# Patient Record
Sex: Male | Born: 1940 | Race: White | Hispanic: No | Smoking: Former smoker
Health system: Southern US, Community
[De-identification: ages and names within clinical notes are randomized; demographics above are authoritative.]

## PROBLEM LIST (undated history)

## (undated) DIAGNOSIS — I219 Acute myocardial infarction, unspecified: Secondary | ICD-10-CM

## (undated) DIAGNOSIS — I2510 Atherosclerotic heart disease of native coronary artery without angina pectoris: Secondary | ICD-10-CM

## (undated) DIAGNOSIS — M1990 Unspecified osteoarthritis, unspecified site: Secondary | ICD-10-CM

## (undated) DIAGNOSIS — N400 Enlarged prostate without lower urinary tract symptoms: Secondary | ICD-10-CM

## (undated) DIAGNOSIS — E039 Hypothyroidism, unspecified: Secondary | ICD-10-CM

## (undated) HISTORY — DX: Atherosclerotic heart disease of native coronary artery without angina pectoris: I25.10

## (undated) HISTORY — DX: Hypothyroidism, unspecified: E03.9

## (undated) HISTORY — PX: ELBOW SURGERY: SHX618

## (undated) HISTORY — PX: REPAIR KNEE LIGAMENT: SUR1188

## (undated) HISTORY — DX: Unspecified osteoarthritis, unspecified site: M19.90

## (undated) HISTORY — PX: CORONARY ANGIOPLASTY WITH STENT PLACEMENT: SHX49

## (undated) HISTORY — DX: Benign prostatic hyperplasia without lower urinary tract symptoms: N40.0

## (undated) HISTORY — PX: CATARACT EXTRACTION: SUR2

## (undated) HISTORY — PX: THUMB AMPUTATION: SHX804

## (undated) HISTORY — PX: MASTOIDECTOMY: SHX711

---

## 2010-02-27 ENCOUNTER — Encounter: Payer: Self-pay | Admitting: Internal Medicine

## 2010-09-01 ENCOUNTER — Ambulatory Visit
Admission: RE | Admit: 2010-09-01 | Discharge: 2010-09-01 | Payer: Self-pay | Source: Home / Self Care | Attending: Internal Medicine | Admitting: Internal Medicine

## 2010-09-01 ENCOUNTER — Other Ambulatory Visit: Payer: Self-pay | Admitting: Internal Medicine

## 2010-09-01 DIAGNOSIS — E1129 Type 2 diabetes mellitus with other diabetic kidney complication: Secondary | ICD-10-CM | POA: Insufficient documentation

## 2010-09-01 DIAGNOSIS — M1990 Unspecified osteoarthritis, unspecified site: Secondary | ICD-10-CM | POA: Insufficient documentation

## 2010-09-01 DIAGNOSIS — E785 Hyperlipidemia, unspecified: Secondary | ICD-10-CM | POA: Insufficient documentation

## 2010-09-01 DIAGNOSIS — E1165 Type 2 diabetes mellitus with hyperglycemia: Secondary | ICD-10-CM

## 2010-09-01 DIAGNOSIS — I2510 Atherosclerotic heart disease of native coronary artery without angina pectoris: Secondary | ICD-10-CM | POA: Insufficient documentation

## 2010-09-01 DIAGNOSIS — E0590 Thyrotoxicosis, unspecified without thyrotoxic crisis or storm: Secondary | ICD-10-CM | POA: Insufficient documentation

## 2010-09-01 DIAGNOSIS — N400 Enlarged prostate without lower urinary tract symptoms: Secondary | ICD-10-CM | POA: Insufficient documentation

## 2010-09-01 DIAGNOSIS — H9190 Unspecified hearing loss, unspecified ear: Secondary | ICD-10-CM | POA: Insufficient documentation

## 2010-09-01 LAB — LIPID PANEL
Cholesterol: 124 mg/dL (ref 0–200)
Triglycerides: 158 mg/dL — ABNORMAL HIGH (ref 0.0–149.0)

## 2010-09-01 LAB — CBC WITH DIFFERENTIAL/PLATELET
Basophils Absolute: 0 10*3/uL (ref 0.0–0.1)
Eosinophils Absolute: 0.4 10*3/uL (ref 0.0–0.7)
Eosinophils Relative: 5.1 % — ABNORMAL HIGH (ref 0.0–5.0)
MCHC: 34.1 g/dL (ref 30.0–36.0)
MCV: 91.8 fl (ref 78.0–100.0)
Monocytes Absolute: 0.7 10*3/uL (ref 0.1–1.0)
Neutrophils Relative %: 62.9 % (ref 43.0–77.0)
Platelets: 171 10*3/uL (ref 150.0–400.0)
RDW: 13.4 % (ref 11.5–14.6)
WBC: 7.4 10*3/uL (ref 4.5–10.5)

## 2010-09-01 LAB — RENAL FUNCTION PANEL
Calcium: 9.6 mg/dL (ref 8.4–10.5)
Creatinine, Ser: 1.2 mg/dL (ref 0.4–1.5)
Glucose, Bld: 72 mg/dL (ref 70–99)
Sodium: 145 mEq/L (ref 135–145)

## 2010-09-01 LAB — HEPATIC FUNCTION PANEL
AST: 28 U/L (ref 0–37)
Albumin: 4.2 g/dL (ref 3.5–5.2)
Alkaline Phosphatase: 61 U/L (ref 39–117)
Total Protein: 6.9 g/dL (ref 6.0–8.3)

## 2010-09-01 LAB — HEMOGLOBIN A1C: Hgb A1c MFr Bld: 8.4 % — ABNORMAL HIGH (ref 4.6–6.5)

## 2010-09-02 ENCOUNTER — Telehealth: Payer: Self-pay | Admitting: Internal Medicine

## 2010-09-04 ENCOUNTER — Encounter: Payer: Self-pay | Admitting: Internal Medicine

## 2010-09-10 ENCOUNTER — Other Ambulatory Visit: Payer: Medicare Other | Admitting: Ophthalmology

## 2010-09-10 NOTE — Assessment & Plan Note (Addendum)
Summary: NEW MEDICARE PT TO ESTABH/DLO   Vital Signs:  Patient profile:   70 year old male Height:      72 inches Weight:      228 pounds BMI:     31.03 Temp:     97.9 degrees F oral Pulse rate:   59 / minute Pulse rhythm:   regular BP sitting:   115 / 58  (left arm) Cuff size:   large  Vitals Entered By: Mervin Hack CMA Duncan Dull) (September 01, 2010 12:10 PM) CC: new patient to establish care Is Patient Diabetic? Yes Did you bring your meter with you today? No   History of Present Illness: Establishing here Just moved here  History of CAD  MI first in 1991 Has had another in 1996 (stent), 2 more stents in 2003, another in 2005 Now on statin despite fairly normal chol levels Last stress test  ~6 months ago  Has DM--diagnosed in 1991 with first MI Insulin since that time--then to by mouth rx--now on both  He is not sure about BP but he is on med for this Diuretic suggests the diagnosis of HTN but he states this is from ENT for hearing loss  Has been losing vision Not sure why needs eye exam  some arthritis problems Larey Seat off ladder from 6 feet 2009 Left knee damage then  Distant history of hyperthyroidism on meds for awhile but not of late  BPH ---flomax in past but off now  Preventive Screening-Counseling & Management  Alcohol-Tobacco     Smoking Status: quit  Allergies (verified): 1)  ! 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 Niece Delia Chimes has health care POA. Would accept resuscitation but no prolonged life support. Not sure about tube feedsSmoking Status:  quit  Review of Systems General:  Weight is stable--may be down slightly since June sleeps fair--- rarely takes "z" sleeping pill wears seat belt. Eyes:  Complains of blurring, double vision, and vision loss-both eyes. ENT:  Complains of decreased hearing and ringing in ears; Own teeth--sees dentist regularly. CV:  Complains of lightheadness; denies chest pain or discomfort, difficulty breathing at night, difficulty breathing while lying down, fainting, palpitations, and shortness of breath with exertion; gets dizzy if he bends over---apparently vestibular. Uses meclizine. Resp:  Complains of cough; denies shortness of breath; occ cough. GI:  Complains of constipation; denies abdominal pain, bloody stools, change in bowel habits, dark tarry stools, indigestion, nausea, and vomiting; slight constipation with move and travelling occ uses glycerin supp. GU:  Complains of urinary frequency and urinary hesitancy; up once to void. MS:  Complains of joint pain; denies joint swelling; left knee pain Not much exercise. Derm:  Denies lesion(s) and rash; gets scratched by kitten. Neuro:  Complains of headaches; denies numbness, tingling, and weakness; occ headaches--tylenol helps Feet get cold but not numb. Psych:  Denies anxiety and depression; has had some grieving with wife's death but seems to be okay. Heme:  Denies abnormal bruising and  enlarge lymph nodes; no problems with bruising on lower dose aspirin. Allergy:  Complains of seasonal allergies and sneezing; usually doesn't take meds.  Physical Exam  General:  alert and normal appearance.   Eyes:  pupils equal, pupils round, and pupils reactive to light.   Mouth:  no erythema, no exudates, and  no lesions.   Neck:  supple, no masses, no thyromegaly, no carotid bruits, and no cervical lymphadenopathy.   Lungs:  normal respiratory effort, no intercostal retractions, no accessory muscle use, and normal breath sounds.   Heart:  normal rate, regular rhythm, no murmur, and no gallop.   Abdomen:  soft and non-tender.   Msk:  thckening in hands and decreased flexibility fairly widespread Pulses:  1+ in feet Extremities:  slight dependent rubor but no edema Neurologic:  alert & oriented X3, strength normal in all extremities, and gait normal.   Skin:  no suspicious lesions and no ulcerations.   Axillary Nodes:  No palpable lymphadenopathy Psych:  normally interactive, good eye contact, not anxious appearing, and not depressed appearing.  Still some anger and frustration about wife's death---had MI and felt it could have been found earlier  Diabetes Management Exam:    Foot Exam (with socks and/or shoes not present):       Sensory-Pinprick/Light touch:          Left medial foot (L-4): normal          Left dorsal foot (L-5): normal          Left lateral foot (S-1): normal          Right medial foot (L-4): normal          Right dorsal foot (L-5): normal          Right lateral foot (S-1): normal       Inspection:          Left foot: normal          Right foot: normal       Nails:          Left foot: fungal infection          Right foot: fungal infection   Impression & Recommendations:  Problem # 1:  CORONARY ARTERY DISEASE (ICD-414.00) Assessment Unchanged  seems to be stable EKG done---serves as my baseline (sinus brady, LAHB, prior AWMI) will set up with local cardiologist  His updated medication list for this problem includes:    Metoprolol Tartrate 50 Mg Tabs (Metoprolol tartrate) .Marland Kitchen... Take 1 by mouth every 12 hours once daily    Losartan Potassium 50 Mg Tabs (Losartan potassium) .Marland Kitchen... Take 1 by mouth once daily    Triamterene-hctz 37.5-25 Mg Caps (Triamterene-hctz) .Marland Kitchen...  Take 1 by mouth once daily    Nitrostat 0.4 Mg Subl (Nitroglycerin) .Marland Kitchen... As directed  Orders: EKG w/ Interpretation (93000) TLB-Renal Function Panel (80069-RENAL) TLB-CBC Platelet - w/Differential (85025-CBCD) Cardiology Referral (Cardiology)  Problem # 2:  DIABETES MELLITUS, TYPE II (ICD-250.00) Assessment: Comment Only  will check labs hopefully okay needs eye appt  His updated medication list for this problem includes:    Metformin Hcl 500 Mg Tabs (Metformin hcl) .Marland Kitchen... Take 2 by mouth two times a day    Humulin 70/30 70-30 % Susp (Insulin isophane & regular) ..... Inject 50-60units every morning and 20-40 units every evening    Losartan Potassium 50 Mg Tabs (Losartan potassium) .Marland Kitchen... Take 1 by mouth once daily  Orders: TLB-A1C / Hgb A1C (Glycohemoglobin) (16109-U0A) Ophthalmology Referral (Ophthalmology)  Problem # 3:  HYPERLIPIDEMIA (ICD-272.4) Assessment: Comment Only  will check labs  His updated medication list for this problem includes:    Simvastatin 40 Mg Tabs (Simvastatin) .Marland Kitchen... Take 1 by mouth once daily at bedtime  Orders: TLB-Lipid Panel (80061-LIPID) TLB-Hepatic/Liver Function Pnl (80076-HEPATIC)  Problem # 4:  BENIGN PROSTATIC HYPERTROPHY (ICD-600.00) Assessment: Unchanged mild  okay without meds for now  Problem # 5:  HYPERTHYROIDISM (ICD-242.90) Assessment: Comment Only  will check labs  His updated medication list for this problem includes:    Metoprolol Tartrate 50 Mg Tabs (Metoprolol tartrate) .Marland Kitchen... Take 1 by mouth every 12 hours once daily  Orders: TLB-T4 (Thyrox), Free [phone]) TLB-TSH (Thyroid Stimulating Hormone) (84443-TSH) Venipuncture (91478)  Problem # 6:  DECREASED HEARING (ICD-389.9) Assessment: Deteriorated  needs ENT eval  Orders: ENT Referral (ENT)  Complete Medication List: 1)  Metoprolol Tartrate 50 Mg Tabs (Metoprolol tartrate) .... Take 1 by mouth every 12 hours once daily 2)  Metformin Hcl 500 Mg Tabs  (Metformin hcl) .... Take 2 by mouth two times a day 3)  Humulin 70/30 70-30 % Susp (Insulin isophane & regular) .... Inject 50-60units every morning and 20-40 units every evening 4)  Losartan Potassium 50 Mg Tabs (Losartan potassium) .... Take 1 by mouth once daily 5)  Triamterene-hctz 37.5-25 Mg Caps (Triamterene-hctz) .... Take 1 by mouth once daily 6)  Simvastatin 40 Mg Tabs (Simvastatin) .... Take 1 by mouth once daily at bedtime 7)  Motion Sickness Relief Ii 25 Mg Tabs (Meclizine hcl) .... Take 1 by mouth once daily as needed 8)  Omega-3 350 Mg Caps (Omega-3 fatty acids) .... Take 1 by mouth once daily as needed 9)  One-a-day 50 Plus Tabs (Multiple vitamins-minerals) .... Take 1 by mouth once daily 10)  Nitrostat 0.4 Mg Subl (Nitroglycerin) .... As directed  Other Orders: Pneumococcal Vaccine (29562) Admin 1st Vaccine (13086)  Patient Instructions: 1)  Please get last 3 years of records from physician in [place] 2)  Please schedule a follow-up appointment in 4 months .  3)  Please set up appts with eye doctor, ENT for hearing and cardiologist 4)  Referral Appointment Information 5)  Day/Date: 6)  Time: 7)  Place/MD: 8)  Address: 9)  Phone/Fax: 10)  Patient given appointment information. Information/Orders faxed/mailed. 11)  Referral Appointment Information 12)  Day/Date: 13)  Time: 14)  Place/MD: 15)  Address: 16)  Phone/Fax: 17)  Patient given appointment information. Information/Orders faxed/mailed. 18)  Referral Appointment Information 19)  Day/Date: 20)  Time: 21)  Place/MD: 22)  Address: 23)  Phone/Fax: 24)  Patient given appointment information. Information/Orders faxed/mailed. Prescriptions: SIMVASTATIN 40 MG TABS (SIMVASTATIN) take 1 by mouth once daily at bedtime  #30 x 0   Entered and Authorized by:   Cindee Salt MD   Signed by:   Cindee Salt MD on 09/01/2010   Method used:   Electronically to        [facility]  [address] [phone]* (retail)        [address]       [place], [place]  [address]       Ph: [phone]       Fax: [phone]   RxID:   [id number] TRIAMTERENE-HCTZ 37.5-25 MG CAPS (TRIAMTERENE-HCTZ) take 1 by mouth once daily  #30 x 0   Entered and Authorized by:   Cindee Salt MD   Signed by:   Cindee Salt  MD on 09/01/2010   Method used:   [facility]        [facility]  [address] [id number] ([facility]ail)       [address]       [place], [place]  [address]       Ph: [phone]       Fax: [phone]   RxID:   [id number] LOSARTAN POTASSIUM 50 MG TABS (LOSARTAN POTASSIUM) take 1 by mouth once daily  #30 x 0   Entered and Authorized by:   Cindee Salt MD   Signed by:   Cindee Salt MD on 09/01/2010   Method used:   [facility]ronically [facility]        [facility]  [address] [phone]* (retail)       [address]       [place], [place]  [address]       Ph: [phone]       Fax: [phone]   RxID:   [id number] HUMULIN 70/30 70-30 % SUSP (INSULIN ISOPHANE & REGULAR) inject 50-60units every morning and 20-40 units every evening  #3000 units x 0   Entered and Authorized by:   Cindee Salt MD   Signed by:   Cindee Salt MD on 09/01/2010   Method used:   [facility]ronically to        [facility]  [address] [phone]* (retail)       [address]       [place], [place]  [address]       Ph: [phone]       Fax: [phone]   RxID:   [id number] METFORMIN HCL 500 MG TABS (METFORMIN HCL) take 2 by mouth two times a day  #60 x 0   Entered and Authorized by:   Cindee Salt MD   Signed by:   Cindee Salt MD on 09/01/2010   Method used:   [facility]ically to        [facility]  [address] [phone]* (retail)       [address]       [place], [place]  [address]       Ph: [phone]       Fax: [phone]   RxID:   [id number] METOPROLOL TARTRATE 50 MG TABS (METOPROLOL  TARTRATE) take 1 by mouth every 12 hours once daily  #60 x 0   Entered and Authorized by:   Cindee Salt MD   Signed by:   Cindee Salt MD on 09/01/2010   Method used:   [facility] to        [facility]  [address] [phone]* (retail)       [address]       [place], [place]  [address]       Ph: [phone]       Fax: [phone]   RxID:   [id number] SIMVASTATIN 40 MG TABS (SIMVASTATIN) take 1 by mouth once daily at bedtime  #90 x 3   Entered and Authorized by:   Cindee Salt MD   Signed by:   Cindee Salt MD on 09/01/2010   Method used:   Print then Give to Patient   RxID:   [id number] TRIAMTERENE-HCTZ 37.5-25 MG  CAPS (TRIAMTERENE-HCTZ) take 1 by mouth once daily  #90 x 3   Entered and Authorized by:   Cindee Salt MD   Signed by:   Cindee Salt MD on 09/01/2010   Method used:   Print then Give to Patient   RxID:   [id number] POTASSIUM 50 MG TABS (LOSARTAN POTASSIUM) take 1 by mouth once daily  #90 x 3   Entered and Authorized by:   Cindee Salt MD   Signed by:   Cindee Salt MD on 09/01/2010   Method used:   Print then Give to Patient   RxID:   [id number] HUMULIN 70/30 70-30 % SUSP (INSULIN ISOPHANE & REGULAR) inject 50-60units every morning and 20-40 units every evening  #3 month x 3   Entered and Authorized by:   Cindee Salt MD   Signed by:   Cindee Salt MD on 09/01/2010   Method used:   Print then Give to Patient   RxID:   [id number] METFORMIN HCL 500 MG TABS (METFORMIN HCL) take 2 by mouth two times a day  #180 x 3   Entered and Authorized by:   Cindee Salt MD   Signed by:   Cindee Salt MD on 09/01/2010   Method used:   Print then Give to Patient   RxID:   [id number] METOPROLOL TARTRATE 50 MG TABS (METOPROLOL TARTRATE) take 1 by mouth every 12 hours once daily  #180 x 3   Entered and Authorized by:   Cindee Salt MD   Signed by:    Cindee Salt MD on 09/01/2010   Method used:   Print then Give to Patient   RxID:   [id number]    Orders Added: 1)  Pneumococcal Vaccine [73710] 2)  Admin 1st Vaccine [90471] 3)  New Patient Level IV [62694] 4)  EKG w/ Interpretation [93000] 5)  TLB-A1C / Hgb A1C (Glycohemoglobin) [83036-A1C] 6)  TLB-T4 (Thyrox), Free [85462-VO3J] 7)  TLB-TSH (Thyroid Stimulating Hormone) [84443-TSH] 8)  Venipuncture [36415] 9)  TLB-Renal Function Panel [80069-RENAL] 10)  TLB-CBC Platelet - w/Differential [85025-CBCD] 11)  TLB-Lipid Panel [80061-LIPID] 12)  TLB-Hepatic/Liver Function Pnl [80076-HEPATIC] 13)  Ophthalmology Referral [Ophthalmology] 14)  ENT Referral [ENT] 15)  Cardiology Referral [Cardiology]   Immunization History:  Tetanus/Td Immunization History:    Tetanus/Td:  Historical (08/10/2003)  Pneumovax Immunization History:    Pneumovax:  Pneumovax (Medicare) (09/01/2010)  Immunizations Administered:  Pneumonia Vaccine:    Vaccine Type: Pneumovax (Medicare)    Site: left deltoid    Mfr: Merck    Dose: 0.5 ml    Route: IM    Given by: Mervin Hack CMA (AAMA)    Exp. Date: 01/01/2012    Lot #: [id number]    VIS given: 07/14/09 version given September 01, 2010.   Immunization History:  Tetanus/Td Immunization History:    Tetanus/Td:  Historical (08/10/2003)  Immunizations Administered:  Pneumonia Vaccine:    Vaccine Type: Pneumovax (Medicare)    Site: left deltoid    Mfr: Merck    Dose: 0.5 ml    Route: IM    Given by: Mervin Hack CMA (AAMA)    Exp. Date: 01/01/2012    Lot #: [id number]    VIS given: 07/14/09 version given September 01, 2010.  Current Allergies (reviewed today): ! FLOXIN    Colonoscopy  Procedure date:  08/09/2000  Findings:      Results: Normal. ??hyperplastic polyps or tag not sure of date  Appended Document: NEW MEDICARE PT TO ESTABH/DLO Copy of note faxed to [facility], fax [phone].

## 2010-09-10 NOTE — Progress Notes (Signed)
Summary: metformin  Phone Note Refill Request Message from:  Patient on September 02, 2010 1:57 PM  Refills Requested: Medication #1:  METFORMIN HCL 500 MG TABS take 2 by mouth two times a day Patient says that he will be taking 4 tabs a day and he will need 360 instead of 180. Please call patient when rx is ready.  Initial call taken by: Melody Comas,  September 02, 2010 1:58 PM  Follow-up for Phone Call        please change to 1000mg  tabs and prepare Rx for me I will sign tomorrrow Follow-up by: Cindee Salt MD,  September 02, 2010 2:07 PM  Additional Follow-up for Phone Call Additional follow up Details #1::        rx on your desk for signing Additional Follow-up by: DeShannon Smith CMA Duncan Dull),  September 02, 2010 2:59 PM    New/Updated Medications: METFORMIN HCL 1000 MG TABS (METFORMIN HCL) take 2 by mouth once daily Prescriptions: METFORMIN HCL 1000 MG TABS (METFORMIN HCL) take 2 by mouth once daily  #180 x 3   Entered by:   Mervin Hack CMA (AAMA)   Authorized by:   Cindee Salt MD   Signed by:   Mervin Hack CMA (AAMA) on 09/02/2010   Method used:   Print then Give to Patient   RxID:   [id number]

## 2010-09-18 ENCOUNTER — Encounter: Payer: Self-pay | Admitting: Internal Medicine

## 2010-09-18 LAB — HM DIABETES EYE EXAM

## 2010-09-24 ENCOUNTER — Encounter: Payer: Self-pay | Admitting: Cardiovascular Disease

## 2010-09-24 ENCOUNTER — Ambulatory Visit (INDEPENDENT_AMBULATORY_CARE_PROVIDER_SITE_OTHER): Payer: Medicare Other | Admitting: Cardiovascular Disease

## 2010-09-24 DIAGNOSIS — I2510 Atherosclerotic heart disease of native coronary artery without angina pectoris: Secondary | ICD-10-CM

## 2010-09-24 NOTE — Letter (Signed)
Summary: Lab reports 2009-2011  Lab reports 2009-2011   Imported By: Kassie Mends 09/17/2010 08:12:11  _____________________________________________________________________  External Attachment:    Type:   Image     Comment:   External Document

## 2010-09-24 NOTE — Consult Note (Signed)
Summary: [facility]  [facility]   Imported By: Maryln Gottron 09/17/2010 15:30:48  _____________________________________________________________________  External Attachment:    Type:   Image     Comment:   External Document  Appended Document: [facility] hearing loss trying to get surgery records from [facility] will review aide options

## 2010-09-30 NOTE — Assessment & Plan Note (Signed)
Summary: Ronnie Case   Visit Type:  Initial Consult Primary Provider:  Dr. Alphonsus Sias [name]  CC:  pt has no complaints today.  History of Present Illness: 70 yo WM with history of CAD with first MI 36 with stenting procedures in 1996, 2003 and 2005 as well as history of DM, HTN and hyperthyroidism  who is here today as a new patient to establish cardiac care. He has recently moved to our area from  [place]. He was recently seen by Dr. Alphonsus Sias. He has been doing well. No chest pain, SOB, palpitations or near syncope.  Stress test at [facility] in 2011 was normal per pt.   Current Medications (verified): 1)  Metoprolol Tartrate 50 Mg Tabs (Metoprolol Tartrate) .... Take 1 By Mouth Every 12 Hours Once Daily 2)  Humulin 70/30 70-30 % Susp (Insulin Isophane & Regular) .... Inject 50-60units Every Morning and 20-40 Units Every Evening 3)  Losartan Potassium 50 Mg Tabs (Losartan Potassium) .... Take 1 By Mouth Once Daily 4)  Triamterene-Hctz 37.5-25 Mg Caps (Triamterene-Hctz) .... Take 1 By Mouth Once Daily 5)  Simvastatin 40 Mg Tabs (Simvastatin) .... Take 1 By Mouth Once Daily At Bedtime 6)  Motion Sickness Relief Ii 25 Mg Tabs (Meclizine Hcl) .... Take 2 By Mouth Once Daily 7)  Omega-3 350 Mg Caps (Omega-3 Fatty Acids) .... Take 1 By Mouth Once Daily As Needed 8)  One-A-Day 50 Plus  Tabs (Multiple Vitamins-Minerals) .... Take 1 By Mouth Once Daily 9)  Nitrostat 0.4 Mg Subl (Nitroglycerin) .... As Directed 10)  Metformin Hcl 1000 Mg Tabs (Metformin Hcl) .... Take 2 By Mouth Once Daily 11)  Aspirin 81 Mg Tbec (Aspirin) .... Take One Tablet By Mouth Daily  Allergies (verified): 1)  ! Floxin  Past History:  Past Medical History: Coronary artery disease s/p MI 1991, stents 1996, 2003, 2005.  Diabetes mellitus, type II Hyperthyroidism Osteoarthritis Benign prostatic hypertrophy  Past Surgical History: 1970 Work accident--left thumb amputated Cardiac stents 1996, 2003, 2005 Left  elbow surgery (tennis elbow) Radical mastoidectomy on right 1982 (infection) Left knee repair after fall 2009 B cataract surgery 2010  Family History: Reviewed history from 09/01/2010 and no changes required. Mom still alive Dad died of complications after broken hip @80  2 sisters CAD, DM strong in family No prostate or colon cancer  Social History: Reviewed history from 09/01/2010 and no changes required. 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 Niece Delia Chimes has health care POA. Would accept resuscitation but no prolonged life support. Not sure about tube feeds  Review of Systems  The patient denies fatigue, malaise, fever, weight gain/loss, vision loss, decreased hearing, hoarseness, chest pain, palpitations, shortness of breath, prolonged cough, wheezing, sleep apnea, coughing up blood, abdominal pain, blood in stool, nausea, vomiting, diarrhea, heartburn, incontinence, blood in urine, muscle weakness, joint pain, leg swelling, rash, skin lesions, headache, fainting, dizziness, depression, anxiety, enlarged lymph nodes, easy bruising or bleeding, and environmental allergies.    Vital Signs:  Patient profile:   70 year old male Height:      72 inches Weight:      232.50 pounds BMI:     31.65 Pulse rate:   72 / minute Resp:     18 per minute BP sitting:   106 / 68  (left arm)  Vitals Entered By: Celestia Khat, CMA (September 24, 2010 11:48 AM)  Physical Exam  General:  General: Well  developed, well nourished, NAD HEENT: OP clear, mucus membranes moist SKIN: warm, dry Neuro: No focal deficits Musculoskeletal: Muscle strength 5/5 all ext Psychiatric: Mood and affect normal Neck: No JVD, no carotid bruits, no thyromegaly, no lymphadenopathy. Lungs:Clear bilaterally, no wheezes, rhonci, crackles CV: RRR no murmurs, gallops rubs Abdomen: soft, NT, ND, BS  present Extremities: No edema, pulses 2+.    EKG  Procedure date:  09/01/2010  Findings:      sinus brady, 58 bpm. LAFB. Poor R wave progression precordial leads.   Impression & Recommendations:  Problem # 1:  CORONARY ARTERY DISEASE (ICD-414.00) Stable. No changes in therapy. He is on good medical therapy.   His updated medication list for this problem includes:    Metoprolol Tartrate 50 Mg Tabs (Metoprolol tartrate) .Marland Kitchen... Take 1 by mouth every 12 hours once daily    Nitrostat 0.4 Mg Subl (Nitroglycerin) .Marland Kitchen... As directed    Aspirin 81 Mg Tbec (Aspirin) .Marland Kitchen... Take one tablet by mouth daily  His updated medication list for this problem includes:    Metoprolol Tartrate 50 Mg Tabs (Metoprolol tartrate) .Marland Kitchen... Take 1 by mouth every 12 hours once daily    Nitrostat 0.4 Mg Subl (Nitroglycerin) .Marland Kitchen... As directed    Aspirin 81 Mg Tbec (Aspirin) .Marland Kitchen... Take one tablet by mouth daily  Patient Instructions: 1)  Your physician recommends that you schedule a follow-up appointment in: 6 months.

## 2010-10-06 NOTE — Letter (Signed)
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

## 2010-12-18 ENCOUNTER — Encounter: Payer: Self-pay | Admitting: Internal Medicine

## 2010-12-21 ENCOUNTER — Ambulatory Visit (INDEPENDENT_AMBULATORY_CARE_PROVIDER_SITE_OTHER): Payer: Medicare Other | Admitting: Internal Medicine

## 2010-12-21 ENCOUNTER — Encounter: Payer: Self-pay | Admitting: Internal Medicine

## 2010-12-21 VITALS — BP 130/60 | HR 58 | Temp 97.6°F | Ht 72.0 in | Wt 227.0 lb

## 2010-12-21 DIAGNOSIS — M1990 Unspecified osteoarthritis, unspecified site: Secondary | ICD-10-CM

## 2010-12-21 DIAGNOSIS — N400 Enlarged prostate without lower urinary tract symptoms: Secondary | ICD-10-CM

## 2010-12-21 DIAGNOSIS — E119 Type 2 diabetes mellitus without complications: Secondary | ICD-10-CM

## 2010-12-21 DIAGNOSIS — E785 Hyperlipidemia, unspecified: Secondary | ICD-10-CM

## 2010-12-21 DIAGNOSIS — I2510 Atherosclerotic heart disease of native coronary artery without angina pectoris: Secondary | ICD-10-CM

## 2010-12-21 NOTE — Progress Notes (Signed)
Subjective:    Patient ID: Ronnie Case, male    DOB: May 25, 1941, 70 y.o.   MRN: [id number]  HPI DOing okay BIL [facility]) not doing well. Mom in hospice home Stress with this  Saw Dr Sanjuana Kava No changes--doing well No chest pain--carries NTG but hasn't needed it Breathing is fine  Sugars are slightly better than last time Checks about daily--no longer checking bid Has had 2 low sugar readings--could feel the symptoms. No neuroglycopenia  Voiding okay Feels he can't empty first time in AM---needs to walk around and then goes again Awakens at 4AM to void  Ongoing mild arthritis pain Uses tylenol with some relief  Current outpatient prescriptions:aspirin 81 MG tablet, Take 81 mg by mouth daily.  , Disp: , Rfl: ;  fish oil-omega-3 fatty acids 1000 MG capsule, Take 1 g by mouth daily.  , Disp: , Rfl: ;  insulin NPH-insulin regular (NOVOLIN 70/30) (70-30) 100 UNIT/ML injection, Inject 50-60 Units into the skin daily with breakfast. 20-40 units every evening , Disp: , Rfl: ;  losartan (COZAAR) 50 MG tablet, Take 50 mg by mouth daily.  , Disp: , Rfl:  metFORMIN (GLUCOPHAGE) 1000 MG tablet, Take 1,000 mg by mouth 2 (two) times daily with a meal.  , Disp: , Rfl: ;  metoprolol (LOPRESSOR) 50 MG tablet, Take 50 mg by mouth 2 (two) times daily.  , Disp: , Rfl: ;  Multiple Vitamins-Minerals (ONE-A-DAY 50 PLUS PO), Take by mouth daily.  , Disp: , Rfl: ;  nitroGLYCERIN (NITROSTAT) 0.4 MG SL tablet, Place 0.4 mg under the tongue every 5 (five) minutes as needed.  , Disp: , Rfl:  simvastatin (ZOCOR) 40 MG tablet, Take 40 mg by mouth at bedtime.  , Disp: , Rfl: ;  triamterene-hydrochlorothiazide (MAXZIDE-25) 37.5-25 MG per tablet, Take 1 tablet by mouth daily.  , Disp: , Rfl: ;  meclizine (MOTION SICKNESS RELIEF II) 25 MG tablet, Take 50 mg by mouth daily as needed.  , Disp: , Rfl:   Past Medical History  Diagnosis Date  . CAD (coronary artery disease)     s/p MI 1991, stents 1996, 2003, 2005.     . Diabetes mellitus   . Hypothyroidism   . Osteoarthritis   . BPH (benign prostatic hypertrophy)     Past Surgical History  Procedure Date  . Thumb amputation     1970 Work accident--left thumb amputated  . Coronary angioplasty with stent placement     Cardiac stents 1996, 2003, 2005  . Elbow surgery     left ( tennis)  . Mastoidectomy     Radical mastoidectomy on right 1982 (infection)  . Repair knee ligament     Left knee repair after fall 2009  . Cataract extraction     B cataract surgery 2010    Family History  Problem Relation Age of Onset  . Cancer Neg Hx     no prostate or colon cancer    History   Social History  . Marital Status: Widowed    Spouse Name: N/A    Number of Children: N/A  . Years of Education: N/A   Occupational History  . retired- Passenger transport manager 2003    Social History Main Topics  . Smoking status: Former Games developer  . Smokeless tobacco: Not on file  . Alcohol Use: No  . Drug Use: No  . Sexually Active: Not on file   Other Topics Concern  . Not on file   Social History Narrative  [name]'s brother. Niece Delia Chimes has health care POA.Would accept resuscitation but no prolonged life support. Not sure about tube feeds   Review of Systems Weight is stable Sleeps fairly well     Objective:   Physical Exam  Constitutional: He appears well-developed and well-nourished. No distress.  Neck: Normal range of motion. Neck supple. No thyromegaly present.  Cardiovascular: Normal rate, regular rhythm, normal heart sounds and intact distal pulses.  Exam reveals no gallop.   No murmur heard. Pulmonary/Chest: Effort normal and breath sounds normal. No respiratory distress. He has no wheezes. He has no rales.  Musculoskeletal: Normal range of motion. He exhibits no edema and no tenderness.  Lymphadenopathy:    He has no cervical adenopathy.  Neurological:       ??slight decreased sensation on plantar feet  Skin: No rash noted.        onchymycotic toenails---need trimming Early plantar callous  Psychiatric: He has a normal mood and affect. His behavior is normal. Judgment and thought content normal.          Assessment & Plan:

## 2010-12-25 ENCOUNTER — Encounter: Payer: Self-pay | Admitting: *Deleted

## 2010-12-25 ENCOUNTER — Telehealth: Payer: Self-pay | Admitting: *Deleted

## 2010-12-25 NOTE — Telephone Encounter (Signed)
Left message on cell and home number asking pt to return my call. 

## 2010-12-25 NOTE — Telephone Encounter (Signed)
Message copied by Mervin Hack on Fri Dec 25, 2010  9:53 AM ------      Message from: Tillman Abide      Created: Tue Dec 22, 2010  7:40 AM       Please call      Let him know the diabetes control has worsened with HgbA1c up to 9.4%      Please find out exactly how much insulin he uses daily      We should go up on it but I think he has not been careful at all      Have him work on proper eating and walking or other activity daily      Set up repeat A1c in ~2 months      If it is not better then, we will increase his insulin      If he would like to bump up his insulin by 10 units morning and evening now, that would probably be a good idea

## 2011-01-07 ENCOUNTER — Encounter: Payer: Self-pay | Admitting: *Deleted

## 2011-01-07 NOTE — Telephone Encounter (Signed)
Patient not returning my call, will mail letter to home address.

## 2011-01-20 ENCOUNTER — Telehealth: Payer: Self-pay | Admitting: *Deleted

## 2011-01-20 NOTE — Telephone Encounter (Signed)
Patient called back to advise that he's taking 50units in the morning and 50 units in the evening of insulin, pt scheduled lab appt in to repeat A1c.

## 2011-03-02 ENCOUNTER — Other Ambulatory Visit: Payer: Self-pay | Admitting: Internal Medicine

## 2011-03-02 DIAGNOSIS — E119 Type 2 diabetes mellitus without complications: Secondary | ICD-10-CM

## 2011-03-08 ENCOUNTER — Other Ambulatory Visit (INDEPENDENT_AMBULATORY_CARE_PROVIDER_SITE_OTHER): Payer: Medicare Other

## 2011-03-08 DIAGNOSIS — E119 Type 2 diabetes mellitus without complications: Secondary | ICD-10-CM

## 2011-04-13 ENCOUNTER — Ambulatory Visit (INDEPENDENT_AMBULATORY_CARE_PROVIDER_SITE_OTHER): Payer: Medicare Other | Admitting: Internal Medicine

## 2011-04-13 ENCOUNTER — Encounter: Payer: Self-pay | Admitting: Internal Medicine

## 2011-04-13 VITALS — BP 130/63 | HR 58 | Temp 98.0°F | Ht 72.0 in | Wt 235.0 lb

## 2011-04-13 DIAGNOSIS — E119 Type 2 diabetes mellitus without complications: Secondary | ICD-10-CM

## 2011-04-13 MED ORDER — GLUCOSE BLOOD VI STRP: 1.0000 | ORAL_STRIP | Freq: Three times a day (TID) | Status: DC

## 2011-04-13 NOTE — Assessment & Plan Note (Signed)
Back to eating at home Mom had been at hospice home for 8 weeks but recently died Has had some night hypoglycemia with the increased evening dose counselled all of 15 minute visit He feels the 50mg  bid is fine if he is careful about his eating Lab Results  Component Value Date   HGBA1C 9.8* 03/08/2011   Will plan to recheck A1c at next appt

## 2011-04-13 NOTE — Progress Notes (Signed)
  Subjective:    Patient ID: Ronnie Case, male    DOB: 07-Jul-1941, 70 y.o.   MRN: [id number]  HPI Insulin put up to 60 units bid Has had some low sugar reactions in the middle of the night---awakened some with sugars of 40  Still checking bid---AM and before supper AM 80-210. Most under 150 PM sugars 51-232. AVerage in 140s or so  occ gets sensation "that I about to starve"  Had been stressed with mom's illness Recently died in hospice   Review of Systems     Objective:   Physical Exam  Constitutional: He appears well-developed and well-nourished. No distress.  Psychiatric: He has a normal mood and affect. His behavior is normal. Judgment and thought content normal.          Assessment & Plan:

## 2011-06-24 ENCOUNTER — Encounter: Payer: Self-pay | Admitting: Internal Medicine

## 2011-06-24 ENCOUNTER — Ambulatory Visit (INDEPENDENT_AMBULATORY_CARE_PROVIDER_SITE_OTHER): Payer: Medicare Other | Admitting: Internal Medicine

## 2011-06-24 VITALS — BP 145/60 | HR 70 | Temp 98.0°F | Ht 72.0 in | Wt 239.0 lb

## 2011-06-24 DIAGNOSIS — E0590 Thyrotoxicosis, unspecified without thyrotoxic crisis or storm: Secondary | ICD-10-CM

## 2011-06-24 DIAGNOSIS — N400 Enlarged prostate without lower urinary tract symptoms: Secondary | ICD-10-CM

## 2011-06-24 DIAGNOSIS — M1990 Unspecified osteoarthritis, unspecified site: Secondary | ICD-10-CM

## 2011-06-24 DIAGNOSIS — I2510 Atherosclerotic heart disease of native coronary artery without angina pectoris: Secondary | ICD-10-CM

## 2011-06-24 DIAGNOSIS — E119 Type 2 diabetes mellitus without complications: Secondary | ICD-10-CM

## 2011-06-24 DIAGNOSIS — E785 Hyperlipidemia, unspecified: Secondary | ICD-10-CM

## 2011-06-24 NOTE — Assessment & Plan Note (Signed)
does okay with heat and tylenol

## 2011-06-24 NOTE — Progress Notes (Signed)
Subjective:    Patient ID: Ronnie Case, male    DOB: [birth year], 70 y.o.   MRN: [id number]  HPI Has had to decrease his insulin to 55 bid usually Was having hypoglycemic spells around lunchtime  Checks sugars once or twice daily Averaging about 150-160  Occ low sugar down to 45---has definite symptoms. Carries glucose tablets just in case  No chest pain No SOB No palpitations Does have some decreased exercise tolerance over the past few years---no change in past 6 months though  Still on statin No myalgias or stomach trouble with this  Voids fine No trouble with initiation or dribbling  Current Outpatient Prescriptions on File Prior to Visit  Medication Sig Dispense Refill  . aspirin 81 MG tablet Take 81 mg by mouth daily.        . fish oil-omega-3 fatty acids 1000 MG capsule Take 1 g by mouth daily.        Marland Kitchen glucose blood (ACCU-CHEK COMPACT TEST DRUM) test strip 1 each by Other route 3 (three) times daily.  100 each  12  . insulin NPH-insulin regular (NOVOLIN 70/30) (70-30) 100 UNIT/ML injection Inject 50-60 Units into the skin daily with breakfast. 50-60 units every evening      . losartan (COZAAR) 50 MG tablet Take 50 mg by mouth daily.        . metFORMIN (GLUCOPHAGE) 1000 MG tablet Take 1,000 mg by mouth 2 (two) times daily with a meal.        . metoprolol (LOPRESSOR) 50 MG tablet Take 25 mg by mouth 2 (two) times daily.       . Multiple Vitamins-Minerals (ONE-A-DAY 50 PLUS PO) Take by mouth daily.        . nitroGLYCERIN (NITROSTAT) 0.4 MG SL tablet Place 0.4 mg under the tongue every 5 (five) minutes as needed.        . simvastatin (ZOCOR) 40 MG tablet Take 40 mg by mouth at bedtime.        . triamterene-hydrochlorothiazide (MAXZIDE-25) 37.5-25 MG per tablet Take 1 tablet by mouth daily.          Allergies  Allergen Reactions  . Ofloxacin     REACTION: prostate swelling mouth swelling    Past Medical History  Diagnosis Date  . CAD (coronary artery disease)    s/p MI 1991, stents 1996, 2003, 2005.   . Diabetes mellitus   . Hypothyroidism   . Osteoarthritis   . BPH (benign prostatic hypertrophy)     Past Surgical History  Procedure Date  . Thumb amputation     1970 Work accident--left thumb amputated  . Coronary angioplasty with stent placement     Cardiac stents 1996, 2003, 2005  . Elbow surgery     left ( tennis)  . Mastoidectomy     Radical mastoidectomy on right 1982 (infection)  . Repair knee ligament     Left knee repair after fall 2009  . Cataract extraction     B cataract surgery 2010    Family History  Problem Relation Age of Onset  . Cancer Neg Hx     no prostate or colon cancer    History   Social History  . Marital Status: Widowed    Spouse Name: N/A    Number of Children: N/A  . Years of Education: N/A   Occupational History  . retired- Passenger transport manager 2003    Social History Main Topics  . Smoking status: Former Games developer  . Smokeless tobacco:  Never Used  . Alcohol Use: No  . Drug Use: No  . Sexually Active: Not on file   Other Topics Concern  . Not on file   Social History Narrative   [name]'s brother. Niece Delia Chimes has health care POA.Would accept resuscitation but no prolonged life support. Not sure about tube feeds   Review of Systems Sleeps okay but occ will take prolonged nap in day--then affects his night sleep Appetite fine Weight up 3# Notes arthritis pain--usually just uses heating pad and tylenol Hearing loss is worsening     Objective:   Physical Exam  Constitutional: He appears well-developed and well-nourished. No distress.  Neck: Normal range of motion. Neck supple. No thyromegaly present.  Cardiovascular: Normal rate, regular rhythm and normal heart sounds.  Exam reveals no gallop.   No murmur heard.      Very faint pedal pulses  Pulmonary/Chest: Effort normal and breath sounds normal. No respiratory distress. He has no wheezes. He has no rales.  Lymphadenopathy:      He has no cervical adenopathy.  Skin: No erythema.       Dry skin on feet Mycotic toenails  Psychiatric: He has a normal mood and affect. His behavior is normal. Judgment and thought content normal.          Assessment & Plan:

## 2011-06-24 NOTE — Assessment & Plan Note (Signed)
Lab Results  Component Value Date   LDLCALC 55 09/01/2010   Good control Due for labs

## 2011-06-24 NOTE — Assessment & Plan Note (Signed)
Voids okay without specific therapy

## 2011-06-24 NOTE — Assessment & Plan Note (Signed)
hopefully has better control Will check labs

## 2011-06-24 NOTE — Assessment & Plan Note (Signed)
Quiet Still follows with cardiologist

## 2011-06-25 LAB — BASIC METABOLIC PANEL
BUN: 25 mg/dL — ABNORMAL HIGH (ref 6–23)
Chloride: 104 mEq/L (ref 96–112)
Creatinine, Ser: 1.4 mg/dL (ref 0.4–1.5)
Glucose, Bld: 60 mg/dL — ABNORMAL LOW (ref 70–99)
Potassium: 4.2 mEq/L (ref 3.5–5.1)

## 2011-06-25 LAB — LIPID PANEL: HDL: 40.7 mg/dL (ref >39.00)

## 2011-06-25 LAB — CBC WITH DIFFERENTIAL/PLATELET
Basophils Absolute: 0 10*3/uL (ref 0.0–0.1)
Eosinophils Absolute: 0.5 10*3/uL (ref 0.0–0.7)
HCT: 46.4 % (ref 39.0–52.0)
Lymphs Abs: 1.4 10*3/uL (ref 0.7–4.0)
MCV: 91 fl (ref 78.0–100.0)
Monocytes Absolute: 1 10*3/uL (ref 0.1–1.0)
Platelets: 188 10*3/uL (ref 150.0–400.0)
RDW: 14 % (ref 11.5–14.6)

## 2011-06-25 LAB — LDL CHOLESTEROL, DIRECT: Direct LDL: 66.4 mg/dL

## 2011-06-25 LAB — HEPATIC FUNCTION PANEL: Albumin: 4.5 g/dL (ref 3.5–5.2)

## 2011-06-25 LAB — TSH: TSH: 1.34 u[IU]/mL (ref 0.35–5.50)

## 2011-09-13 ENCOUNTER — Other Ambulatory Visit: Payer: Self-pay | Admitting: Internal Medicine

## 2011-10-06 ENCOUNTER — Other Ambulatory Visit: Payer: Self-pay | Admitting: *Deleted

## 2011-10-06 MED ORDER — INSULIN NPH ISOPHANE & REGULAR (70-30) 100 UNIT/ML ~~LOC~~ SUSP: 50.0000 [IU] | Freq: Every day | SUBCUTANEOUS | Status: DC

## 2011-10-18 ENCOUNTER — Other Ambulatory Visit: Payer: Self-pay | Admitting: Internal Medicine

## 2011-11-08 ENCOUNTER — Other Ambulatory Visit: Payer: Self-pay | Admitting: Internal Medicine

## 2011-12-22 ENCOUNTER — Ambulatory Visit: Payer: Medicare Other | Admitting: Internal Medicine

## 2011-12-22 ENCOUNTER — Other Ambulatory Visit: Payer: Self-pay | Admitting: Internal Medicine

## 2011-12-22 DIAGNOSIS — Z0289 Encounter for other administrative examinations: Secondary | ICD-10-CM

## 2012-03-20 ENCOUNTER — Telehealth: Payer: Self-pay | Admitting: *Deleted

## 2012-03-20 NOTE — Telephone Encounter (Signed)
Spoke with patient about using diabetes form at [facility], and pt does want to use [facility]. Form on your desk Also pt re-scheduled appt that he missed in May.

## 2012-03-20 NOTE — Telephone Encounter (Signed)
Form faxed back, pt tests tid

## 2012-03-20 NOTE — Telephone Encounter (Signed)
Checks up to bid No form on desk now

## 2012-03-21 ENCOUNTER — Other Ambulatory Visit: Payer: Self-pay | Admitting: Internal Medicine

## 2012-03-31 ENCOUNTER — Encounter: Payer: Self-pay | Admitting: Internal Medicine

## 2012-03-31 ENCOUNTER — Ambulatory Visit (INDEPENDENT_AMBULATORY_CARE_PROVIDER_SITE_OTHER): Payer: Medicare Other | Admitting: Internal Medicine

## 2012-03-31 VITALS — BP 122/68 | HR 67 | Temp 98.6°F | Ht 72.0 in | Wt 240.0 lb

## 2012-03-31 DIAGNOSIS — N400 Enlarged prostate without lower urinary tract symptoms: Secondary | ICD-10-CM

## 2012-03-31 DIAGNOSIS — E119 Type 2 diabetes mellitus without complications: Secondary | ICD-10-CM

## 2012-03-31 DIAGNOSIS — E785 Hyperlipidemia, unspecified: Secondary | ICD-10-CM

## 2012-03-31 DIAGNOSIS — I2510 Atherosclerotic heart disease of native coronary artery without angina pectoris: Secondary | ICD-10-CM

## 2012-03-31 LAB — LIPID PANEL
Cholesterol: 124 mg/dL (ref 0–200)
HDL: 39.9 mg/dL (ref >39.00)
LDL Cholesterol: 45 mg/dL (ref 0–99)
Triglycerides: 194 mg/dL — ABNORMAL HIGH (ref 0.0–149.0)
VLDL: 38.8 mg/dL (ref 0.0–40.0)

## 2012-03-31 LAB — BASIC METABOLIC PANEL
Chloride: 99 mEq/L (ref 96–112)
Creatinine, Ser: 1.5 mg/dL (ref 0.4–1.5)
GFR: 49.85 mL/min — ABNORMAL LOW (ref >60.00)
Potassium: 4.2 mEq/L (ref 3.5–5.1)

## 2012-03-31 LAB — CBC WITH DIFFERENTIAL/PLATELET
Basophils Relative: 0.4 % (ref 0.0–3.0)
Eosinophils Relative: 6.2 % — ABNORMAL HIGH (ref 0.0–5.0)
MCV: 91.9 fl (ref 78.0–100.0)
Monocytes Absolute: 0.7 10*3/uL (ref 0.1–1.0)
Monocytes Relative: 10 % (ref 3.0–12.0)
Neutrophils Relative %: 64.8 % (ref 43.0–77.0)
RBC: 5.07 Mil/uL (ref 4.22–5.81)
WBC: 7.2 10*3/uL (ref 4.5–10.5)

## 2012-03-31 LAB — HEPATIC FUNCTION PANEL
ALT: 31 U/L (ref 0–53)
AST: 29 U/L (ref 0–37)
Alkaline Phosphatase: 61 U/L (ref 39–117)
Bilirubin, Direct: 0.1 mg/dL (ref 0.0–0.3)
Total Bilirubin: 0.6 mg/dL (ref 0.3–1.2)
Total Protein: 7.5 g/dL (ref 6.0–8.3)

## 2012-03-31 NOTE — Assessment & Plan Note (Signed)
Takes 60/50 of the 70/30 insulin Any mild hypoglycemia is before lunch If a1c increased, will push up evening insulin dose

## 2012-03-31 NOTE — Progress Notes (Signed)
Subjective:    Patient ID: Ronnie Case, male    DOB: 1940/08/12, 71 y.o.   MRN: [id number]  HPI Missed regular follow up in spring  Has been checking sugars up to tid Runny a bit higher of late Average 170-180 random times Fasting is usually 150-160 Some mild hypoglycemic spells Now on 100-110 units daily--split dose Had a lot of hypoglycemia when on humalog 75/25 Had eye exam this spring---Dr Sydnor  Uses exercise bike daily---variable amount No chest pain No SOB No syncope. Does get some mild dizziness---uses meclizine without effect. (relates to inner ear issues though)  No problems with the statin Has generalized stiffness but no true myalgia  Nocturia is stable---every 3 hours or so No sig daytime urgency or frequency--but has mild urge leakage if he doesn't void enough  Current Outpatient Prescriptions on File Prior to Visit  Medication Sig Dispense Refill  . aspirin 81 MG tablet Take 81 mg by mouth daily.        . fish oil-omega-3 fatty acids 1000 MG capsule Take 1 g by mouth daily.        Marland Kitchen glucose blood (ACCU-CHEK COMPACT TEST DRUM) test strip 1 each by Other route 3 (three) times daily.  100 each  12  . insulin NPH-insulin regular (NOVOLIN 70/30) (70-30) 100 UNIT/ML injection Inject 50-60 Units into the skin daily with breakfast. 50-60 units every evening  10 mL  12  . losartan (COZAAR) 50 MG tablet TAKE 1 TABLET BY MOUTH EVERY DAY  90 tablet  3  . metFORMIN (GLUCOPHAGE) 1000 MG tablet TAKE 2 TABLETS BY MOUTH ONCE DAILY  180 tablet  3  . metoprolol (LOPRESSOR) 50 MG tablet TAKE 1 TABLET BY MOUTH EVERY 12 HOURS  180 tablet  1  . Multiple Vitamins-Minerals (ONE-A-DAY 50 PLUS PO) Take by mouth daily.        . nitroGLYCERIN (NITROSTAT) 0.4 MG SL tablet Place 0.4 mg under the tongue every 5 (five) minutes as needed.        . simvastatin (ZOCOR) 40 MG tablet TAKE 1 TABLET BY MOUTH EVERY NIGHT AT BEDTIME  90 tablet  0  . triamterene-hydrochlorothiazide (DYAZIDE) 37.5-25 MG  per capsule TAKE ONE CAPSULE BY MOUTH EVERY DAY  90 capsule  3  . DISCONTD: triamterene-hydrochlorothiazide (MAXZIDE-25) 37.5-25 MG per tablet Take 1 tablet by mouth daily.          Allergies  Allergen Reactions  . Ofloxacin     REACTION: prostate swelling mouth swelling    Past Medical History  Diagnosis Date  . CAD (coronary artery disease)     s/p MI 1991, stents 1996, 2003, 2005.   . Diabetes mellitus   . Hypothyroidism   . Osteoarthritis   . BPH (benign prostatic hypertrophy)     Past Surgical History  Procedure Date  . Thumb amputation     1970 Work accident--left thumb amputated  . Coronary angioplasty with stent placement     Cardiac stents 1996, 2003, 2005  . Elbow surgery     left ( tennis)  . Mastoidectomy     Radical mastoidectomy on right 1982 (infection)  . Repair knee ligament     Left knee repair after fall 2009  . Cataract extraction     B cataract surgery 2010    Family History  Problem Relation Age of Onset  . Cancer Neg Hx     no prostate or colon cancer    History   Social History  . Marital  Status: Widowed    Spouse Name: N/A    Number of Children: N/A  . Years of Education: N/A   Occupational History  . retired- Passenger transport manager 2003    Social History Main Topics  . Smoking status: Former Games developer  . Smokeless tobacco: Never Used  . Alcohol Use: No  . Drug Use: No  . Sexually Active: Not on file   Other Topics Concern  . Not on file   Social History Narrative   [name]'s brother. Niece Delia Chimes has health care POA.Would accept resuscitation but no prolonged life support. Not sure about tube feeds   Review of Systems Some left groin discomfort----not really painful Bowels are regular if he eats properly (like fruit) Sleep is still not good---he will sleep a lot in daytime and doesn't sleep well many nights. Gets up to read Feet get cold but no sensory changes    Objective:   Physical Exam  Constitutional: He  appears well-developed and well-nourished. No distress.  Neck: Normal range of motion. Neck supple. No thyromegaly present.  Cardiovascular: Normal rate, regular rhythm, normal heart sounds and intact distal pulses.  Exam reveals no gallop.   No murmur heard. Pulmonary/Chest: Effort normal and breath sounds normal. No respiratory distress. He has no wheezes. He has no rales.  Abdominal: Soft. There is no tenderness.  Lymphadenopathy:    He has no cervical adenopathy.  Neurological:       Mild balance issues getting up and down from table  Skin: No rash noted. No erythema.       Mycotic toenails and some scaling  No foot ulcers  Psychiatric: He has a normal mood and affect. His behavior is normal.          Assessment & Plan:

## 2012-03-31 NOTE — Assessment & Plan Note (Signed)
Seems to be quiet On appropriate meds 

## 2012-03-31 NOTE — Assessment & Plan Note (Signed)
Some urge incontinence during day Not ready for meds as yet

## 2012-03-31 NOTE — Assessment & Plan Note (Signed)
No problems with the med Will check labs 

## 2012-04-07 ENCOUNTER — Telehealth: Payer: Self-pay | Admitting: Internal Medicine

## 2012-04-07 NOTE — Telephone Encounter (Signed)
Pt notified of lab results and medication change. Also told to come back in 3 mos for follow up A1c

## 2012-04-07 NOTE — Telephone Encounter (Signed)
Thank you :)

## 2012-07-13 ENCOUNTER — Other Ambulatory Visit: Payer: Self-pay | Admitting: Internal Medicine

## 2012-07-14 ENCOUNTER — Other Ambulatory Visit: Payer: Self-pay | Admitting: Internal Medicine

## 2012-08-30 ENCOUNTER — Other Ambulatory Visit: Payer: Self-pay | Admitting: Internal Medicine

## 2012-09-19 ENCOUNTER — Telehealth: Payer: Self-pay

## 2012-09-19 MED ORDER — INSULIN NPH ISOPHANE & REGULAR (70-30) 100 UNIT/ML ~~LOC~~ SUSP: 60.0000 [IU] | Freq: Two times a day (BID) | SUBCUTANEOUS | Status: DC

## 2012-09-19 NOTE — Telephone Encounter (Signed)
Pt left note requesting Novolin 70/30 be changed back to Humulin 70/30. Pt said he knows supposed to be basically the same medicine but pt said when switched to Novolin 70/30 BS stays in 200's. Pt has CPX scheduled 10/24/12. Pt presently taking Novolin 70/30 taking 60 units twice a day (usually at breakfast and supper). Pt said he knows he could do better on diet (pt does not always go by diabetic diet). [facility] [address]Please advise.

## 2012-09-19 NOTE — Telephone Encounter (Signed)
Spoke with patient and advised results, I advised pt after speaking to the pharmacist at [facility] that Novolin 70/30 and Humulin 70/30 are both the same. I called in Humulin 70/30. Pt is very upset that his meds are being changed without him knowing about it, I advised that we didn't change anything, pt states that since we switched over to [facility] that we have changed, I advised pt that we are still [place], pt states his meds are being messed up and his appts are being changed and nothing is like it was when he was in [place]. Pt is mad at the government for changing his insurance. I advised pt that he needs to discuss this with Dr.Letvak and our office manager when he comes in for his visit on 10/24/2012, per pt he will

## 2012-09-19 NOTE — Telephone Encounter (Signed)
Okay to change him back to novolin Sometimes there is a difference I don't know if the insurance may be an issue---may need formulary exception if it is

## 2012-09-20 NOTE — Telephone Encounter (Signed)
They are made by different companies and some people notice a difference (though they are generally interchangeable). Any issues with the insulin are driven by his insurance company, not [place]. We should be sure to notify and confirm it is okay before we make any formulary change

## 2012-10-12 ENCOUNTER — Other Ambulatory Visit: Payer: Self-pay | Admitting: Internal Medicine

## 2012-10-17 ENCOUNTER — Encounter: Payer: Medicare Other | Admitting: Internal Medicine

## 2012-10-24 ENCOUNTER — Ambulatory Visit (INDEPENDENT_AMBULATORY_CARE_PROVIDER_SITE_OTHER): Payer: Medicare Other | Admitting: Internal Medicine

## 2012-10-24 ENCOUNTER — Telehealth: Payer: Self-pay | Admitting: Family Medicine

## 2012-10-24 ENCOUNTER — Encounter: Payer: Self-pay | Admitting: Internal Medicine

## 2012-10-24 VITALS — BP 128/68 | HR 76 | Temp 98.9°F | Ht 75.0 in | Wt 243.5 lb

## 2012-10-24 DIAGNOSIS — M1990 Unspecified osteoarthritis, unspecified site: Secondary | ICD-10-CM

## 2012-10-24 DIAGNOSIS — E119 Type 2 diabetes mellitus without complications: Secondary | ICD-10-CM

## 2012-10-24 DIAGNOSIS — Z1211 Encounter for screening for malignant neoplasm of colon: Secondary | ICD-10-CM

## 2012-10-24 DIAGNOSIS — Z0000 Encounter for general adult medical examination without abnormal findings: Secondary | ICD-10-CM

## 2012-10-24 DIAGNOSIS — N400 Enlarged prostate without lower urinary tract symptoms: Secondary | ICD-10-CM

## 2012-10-24 LAB — HEMOGLOBIN A1C: Hgb A1c MFr Bld: 8.8 % — ABNORMAL HIGH (ref 4.6–6.5)

## 2012-10-24 MED ORDER — NITROGLYCERIN 0.4 MG SL SUBL: 0.4000 mg | SUBLINGUAL_TABLET | SUBLINGUAL | Status: AC | PRN

## 2012-10-24 NOTE — Telephone Encounter (Signed)
Pt is taking Metoprolol 50mg  tablet, 1/2 tablet q12h.  He would like to know if RX can be changed to Metoprolol 25mg  tablets so that he doesn't have to split the pills.  [phone].

## 2012-10-24 NOTE — Assessment & Plan Note (Signed)
Frequency and some leakage if he is delayed Doesn't want Rx

## 2012-10-24 NOTE — Assessment & Plan Note (Signed)
I have personally reviewed the Medicare Annual Wellness questionnaire and have noted 1. The patient's medical and social history 2. Their use of alcohol, tobacco or illicit drugs 3. Their current medications and supplements 4. The patient's functional ability including ADL's, fall risks, home safety risks and hearing or visual             impairment. 5. Diet and physical activities 6. Evidence for depression or mood disorders  The patients weight, height, BMI and visual acuity have been recorded in the chart I have made referrals, counseling and provided education to the patient based review of the above and I have provided the pt with a written personalized care plan for preventive services.  I have provided you with a copy of your personalized plan for preventive services. Please take the time to review along with your updated medication list.  Rx for zostavax May have mild cognitive problems---will need to follow No PSA due to age Will do stool immunoassay

## 2012-10-24 NOTE — Patient Instructions (Addendum)
Please set up an appointment with a podiatrist to trim your toenails  DASH Diet The DASH diet stands for "Dietary Approaches to Stop Hypertension." It is a healthy eating plan that has been shown to reduce high blood pressure (hypertension) in as little as 14 days, while also possibly providing other significant health benefits. These other health benefits include reducing the risk of breast cancer after menopause and reducing the risk of type 2 diabetes, heart disease, colon cancer, and stroke. Health benefits also include weight loss and slowing kidney failure in patients with chronic kidney disease.  DIET GUIDELINES  Limit salt (sodium). Your diet should contain less than 1500 mg of sodium daily.  Limit refined or processed carbohydrates. Your diet should include mostly whole grains. Desserts and added sugars should be used sparingly.  Include small amounts of heart-healthy fats. These types of fats include nuts, oils, and tub margarine. Limit saturated and trans fats. These fats have been shown to be harmful in the body. CHOOSING FOODS  The following food groups are based on a 2000 calorie diet. See your Registered Dietitian for individual calorie needs. Grains and Grain Products (6 to 8 servings daily)  Eat More Often: Whole-wheat bread, brown rice, whole-grain or wheat pasta, quinoa, popcorn without added fat or salt (air popped).  Eat Less Often: White bread, white pasta, white rice, cornbread. Vegetables (4 to 5 servings daily)  Eat More Often: Fresh, frozen, and canned vegetables. Vegetables may be raw, steamed, roasted, or grilled with a minimal amount of fat.  Eat Less Often/Avoid: Creamed or fried vegetables. Vegetables in a cheese sauce. Fruit (4 to 5 servings daily)  Eat More Often: All fresh, canned (in natural juice), or frozen fruits. Dried fruits without added sugar. One hundred percent fruit juice ( cup [237 mL] daily).  Eat Less Often: Dried fruits with added sugar.  Canned fruit in light or heavy syrup. [employer], Fish, and Poultry (2 servings or less daily. One serving is 3 to 4 oz [85-114 g]).  Eat More Often: Ninety percent or leaner ground beef, tenderloin, sirloin. Round cuts of beef, chicken breast, [place] breast. All fish. Grill, bake, or broil your meat. Nothing should be fried.  Eat Less Often/Avoid: Fatty cuts of meat, [place], or chicken leg, thigh, or wing. Fried cuts of meat or fish. Dairy (2 to 3 servings)  Eat More Often: Low-fat or fat-free milk, low-fat plain or light yogurt, reduced-fat or part-skim cheese.  Eat Less Often/Avoid: Milk (whole, 2%).Whole milk yogurt. Full-fat cheeses. Nuts, Seeds, and Legumes (4 to 5 servings per week)  Eat More Often: All without added salt.  Eat Less Often/Avoid: Salted nuts and seeds, canned beans with added salt. Fats and Sweets (limited)  Eat More Often: Vegetable oils, tub margarines without trans fats, sugar-free gelatin. Mayonnaise and salad dressings.  Eat Less Often/Avoid: Coconut oils, palm oils, butter, stick margarine, cream, half and half, cookies, candy, pie. FOR MORE INFORMATION The Dash Diet Eating Plan: www.dashdiet.org Document Released: 07/15/2011 Document Revised: 10/18/2011 Document Reviewed: 07/15/2011 [facility] Patient Information 2013 [place], [place].

## 2012-10-24 NOTE — Addendum Note (Signed)
Addended by: Criselda Peaches B on: 10/24/2012 04:51 PM   Modules accepted: Orders

## 2012-10-24 NOTE — Progress Notes (Signed)
Subjective:    Patient ID: Ronnie Case, male    DOB: [birth year], 72 y.o.   MRN: [id number]  HPI Here for Medicare wellness and follow up Regular eye exams Hearing loss--follows with audiology Former smoker--doesn't drink alcohol Reviewed other doctors Reviewed advanced directives Occasional mild mood issues, no anhedonia Independent with ADLs and instrumental ADLs  He feels his diabetes is much better His pharmacy had changed from humulin to novolin--that is when things got worse Still checks at least once a day Trying to be more careful with diet---but does go up with more carbs Fasting is usually 120  No heart trouble No chest pain--likes to carry NTG but hasn't needed Did get catch in throat once when running to catch his cat No SOB No edema No palpitations  Voids okay Nocturia x 2 generally---goes every 4 hours or so night and day No troublesome urgency May have trouble with leakage if he doesn't void in 4 hours  Current Outpatient Prescriptions on File Prior to Visit  Medication Sig Dispense Refill  . ACCU-CHEK COMPACT TEST DRUM test strip TEST BLOOD SUGAR THREE TIMES DAILY  102 each  3  . aspirin 81 MG tablet Take 81 mg by mouth daily.        . fish oil-omega-3 fatty acids 1000 MG capsule Take 1 g by mouth daily.        Marland Kitchen losartan (COZAAR) 50 MG tablet TAKE 1 TABLET BY MOUTH EVERY DAY  90 tablet  3  . metFORMIN (GLUCOPHAGE) 1000 MG tablet TAKE 2 TABLETS BY MOUTH ONCE DAILY  180 tablet  3  . metoprolol (LOPRESSOR) 50 MG tablet TAKE 1 TABLET BY MOUTH EVERY 12 HOURS  180 tablet  1  . Multiple Vitamins-Minerals (ONE-A-DAY 50 PLUS PO) Take by mouth daily.        . simvastatin (ZOCOR) 40 MG tablet TAKE 1 TABLET BY MOUTH EVERY NIGHT AT BEDTIME  90 tablet  0  . triamterene-hydrochlorothiazide (MAXZIDE-25) 37.5-25 MG per tablet TAKE 1 TABLET BY MOUTH EVERY DAY  90 tablet  0   No current facility-administered medications on file prior to visit.    Allergies  Allergen  Reactions  . Ofloxacin     REACTION: prostate swelling mouth swelling    Past Medical History  Diagnosis Date  . CAD (coronary artery disease)     s/p MI 1991, stents 1996, 2003, 2005.   . Diabetes mellitus   . Hypothyroidism   . Osteoarthritis   . BPH (benign prostatic hypertrophy)     Past Surgical History  Procedure Laterality Date  . Thumb amputation      1970 Work accident--left thumb amputated  . Coronary angioplasty with stent placement      Cardiac stents 1996, 2003, 2005  . Elbow surgery      left ( tennis)  . Mastoidectomy      Radical mastoidectomy on right 1982 (infection)  . Repair knee ligament      Left knee repair after fall 2009  . Cataract extraction      B cataract surgery 2010    Family History  Problem Relation Age of Onset  . Cancer Neg Hx     no prostate or colon cancer    History   Social History  . Marital Status: Widowed    Spouse Name: N/A    Number of Children: N/A  . Years of Education: N/A   Occupational History  . retired- Passenger transport manager 2003    Social History Main  Topics  . Smoking status: Former Games developer  . Smokeless tobacco: Never Used  . Alcohol Use: No  . Drug Use: No  . Sexually Active: Not on file   Other Topics Concern  . Not on file   Social History Narrative   [name]'s brother.    Has living will.   Niece Delia Chimes has health care POA.   Would accept resuscitation but no prolonged life support.    Not sure about tube feeds            Review of Systems Weight is about the same Has some trouble with stairs now---may move to have just 1 level Chronic sleep problems---needs to supplement in day    Objective:   Physical Exam  Constitutional: He is oriented to person, place, and time. He appears well-developed and well-nourished. No distress.  Neck: Normal range of motion. Neck supple. No thyromegaly present.  Cardiovascular: Normal rate, regular rhythm, normal heart sounds and intact distal  pulses.  Exam reveals no gallop.   No murmur heard. Pulmonary/Chest: Effort normal and breath sounds normal. No respiratory distress. He has no wheezes. He has no rales.  Abdominal: Soft. There is no tenderness.  Musculoskeletal: He exhibits no edema.  Stiff with movement  Lymphadenopathy:    He has no cervical adenopathy.  Neurological: He is alert and oriented to person, place, and time.  President-- "Obama, Bush, Clinton" [phone] Recall 1/3  Skin: No erythema.  Mild hyperpigmentation in calves Scaling on plantar feet Thick, long mycotic toenails  Psychiatric: He has a normal mood and affect. His behavior is normal.          Assessment & Plan:

## 2012-10-24 NOTE — Assessment & Plan Note (Signed)
Stiff but prefers not to take meds

## 2012-10-24 NOTE — Assessment & Plan Note (Signed)
Hopefully better with change back to humulin Discussed proper eating and exercise

## 2012-10-25 ENCOUNTER — Other Ambulatory Visit: Payer: Self-pay | Admitting: Family Medicine

## 2012-10-25 MED ORDER — METOPROLOL TARTRATE 25 MG PO TABS: 25.0000 mg | ORAL_TABLET | Freq: Two times a day (BID) | ORAL | Status: DC

## 2012-10-25 NOTE — Telephone Encounter (Signed)
Okay to send new Rx for 25mg  bid #60 x 11

## 2012-10-26 LAB — HM DIABETES EYE EXAM

## 2012-10-30 ENCOUNTER — Other Ambulatory Visit: Payer: Self-pay | Admitting: Internal Medicine

## 2012-10-31 ENCOUNTER — Encounter: Payer: Self-pay | Admitting: *Deleted

## 2012-11-06 ENCOUNTER — Encounter: Payer: Self-pay | Admitting: Internal Medicine

## 2012-11-27 ENCOUNTER — Other Ambulatory Visit: Payer: Self-pay | Admitting: Internal Medicine

## 2012-11-28 ENCOUNTER — Other Ambulatory Visit: Payer: Self-pay | Admitting: Internal Medicine

## 2013-01-11 ENCOUNTER — Other Ambulatory Visit: Payer: Self-pay | Admitting: Internal Medicine

## 2013-02-24 ENCOUNTER — Other Ambulatory Visit: Payer: Self-pay | Admitting: Internal Medicine

## 2013-04-10 ENCOUNTER — Other Ambulatory Visit: Payer: Self-pay | Admitting: Internal Medicine

## 2013-04-30 ENCOUNTER — Encounter: Payer: Self-pay | Admitting: Internal Medicine

## 2013-04-30 ENCOUNTER — Ambulatory Visit (INDEPENDENT_AMBULATORY_CARE_PROVIDER_SITE_OTHER): Payer: Medicare Other | Admitting: Internal Medicine

## 2013-04-30 VITALS — BP 120/70 | HR 83 | Wt 240.0 lb

## 2013-04-30 DIAGNOSIS — Z0000 Encounter for general adult medical examination without abnormal findings: Secondary | ICD-10-CM

## 2013-04-30 DIAGNOSIS — E0590 Thyrotoxicosis, unspecified without thyrotoxic crisis or storm: Secondary | ICD-10-CM

## 2013-04-30 DIAGNOSIS — N400 Enlarged prostate without lower urinary tract symptoms: Secondary | ICD-10-CM

## 2013-04-30 DIAGNOSIS — I2510 Atherosclerotic heart disease of native coronary artery without angina pectoris: Secondary | ICD-10-CM

## 2013-04-30 DIAGNOSIS — M1990 Unspecified osteoarthritis, unspecified site: Secondary | ICD-10-CM

## 2013-04-30 DIAGNOSIS — E785 Hyperlipidemia, unspecified: Secondary | ICD-10-CM

## 2013-04-30 LAB — BASIC METABOLIC PANEL
CO2: 26 mEq/L (ref 19–32)
Calcium: 9.6 mg/dL (ref 8.4–10.5)
Chloride: 99 mEq/L (ref 96–112)
Creatinine, Ser: 1.5 mg/dL (ref 0.4–1.5)
Glucose, Bld: 347 mg/dL — ABNORMAL HIGH (ref 70–99)
Sodium: 136 mEq/L (ref 135–145)

## 2013-04-30 LAB — HEPATIC FUNCTION PANEL
ALT: 29 U/L (ref 0–53)
Albumin: 4.3 g/dL (ref 3.5–5.2)
Bilirubin, Direct: 0.1 mg/dL (ref 0.0–0.3)
Total Protein: 7.5 g/dL (ref 6.0–8.3)

## 2013-04-30 LAB — HEMOGLOBIN A1C: Hgb A1c MFr Bld: 10.3 % — ABNORMAL HIGH (ref 4.6–6.5)

## 2013-04-30 LAB — CBC WITH DIFFERENTIAL/PLATELET
Basophils Absolute: 0 10*3/uL (ref 0.0–0.1)
Basophils Relative: 0.5 % (ref 0.0–3.0)
Eosinophils Absolute: 0.4 10*3/uL (ref 0.0–0.7)
Hemoglobin: 15.8 g/dL (ref 13.0–17.0)
Lymphocytes Relative: 16.4 % (ref 12.0–46.0)
MCHC: 33.9 g/dL (ref 30.0–36.0)
MCV: 90.5 fl (ref 78.0–100.0)
Monocytes Absolute: 0.7 10*3/uL (ref 0.1–1.0)
Neutro Abs: 5.6 10*3/uL (ref 1.4–7.7)
RBC: 5.15 Mil/uL (ref 4.22–5.81)
RDW: 13.2 % (ref 11.5–14.6)

## 2013-04-30 LAB — LIPID PANEL
Cholesterol: 126 mg/dL (ref 0–200)
HDL: 37.3 mg/dL — ABNORMAL LOW (ref >39.00)
VLDL: 40 mg/dL (ref 0.0–40.0)

## 2013-04-30 NOTE — Assessment & Plan Note (Signed)
Has been quiet On statin, ASA, beta blocker, ARB

## 2013-04-30 NOTE — Assessment & Plan Note (Signed)
No problems on the statin Due for labs 

## 2013-04-30 NOTE — Progress Notes (Signed)
Subjective:    Patient ID: Ronnie Case, male    DOB: Jun 12, 1941, 72 y.o.   MRN: [id number]  HPI Here for follow up  Reviewed his poor diabetes control Knows how to eat but not compliant No hypoglycemic reactions of note Fasting sugars ~165 (usually about the same at night also) Checks every other day usually----more often in past 70/30 insulin 60 bid  Has exercise bike Will pedal briefly at times Feels limited by knees---discussed trying tylenol and then pushing it some  No chest pain No SOB No sig edema  Current Outpatient Prescriptions on File Prior to Visit  Medication Sig Dispense Refill  . ACCU-CHEK COMPACT TEST DRUM test strip TEST BLOOD SUGAR THREE TIMES DAILY  102 each  3  . aspirin 81 MG tablet Take 81 mg by mouth daily.        . fish oil-omega-3 fatty acids 1000 MG capsule Take 1 g by mouth daily.        . insulin NPH-insulin regular (NOVOLIN 70/30) (70-30) 100 UNIT/ML injection Inject 60 Units into the skin 2 (two) times daily with a meal.      . losartan (COZAAR) 50 MG tablet TAKE 1 TABLET BY MOUTH EVERY DAY  90 tablet  3  . metFORMIN (GLUCOPHAGE) 1000 MG tablet TAKE 2 TABLETS BY MOUTH EVERY DAY  180 tablet  3  . metoprolol tartrate (LOPRESSOR) 25 MG tablet Take 1 tablet (25 mg total) by mouth 2 (two) times daily.  180 tablet  3  . Multiple Vitamins-Minerals (ONE-A-DAY 50 PLUS PO) Take by mouth daily.        . nitroGLYCERIN (NITROSTAT) 0.4 MG SL tablet Place 1 tablet (0.4 mg total) under the tongue every 5 (five) minutes as needed.  25 tablet  3  . simvastatin (ZOCOR) 40 MG tablet TAKE 1 TABLET BY MOUTH EVERY NIGHT AT BEDTIME  90 tablet  0  . triamterene-hydrochlorothiazide (MAXZIDE-25) 37.5-25 MG per tablet TAKE 1 TABLET BY MOUTH EVERY DAY  90 tablet  0   No current facility-administered medications on file prior to visit.    Allergies  Allergen Reactions  . Ofloxacin     REACTION: prostate swelling mouth swelling    Past Medical History  Diagnosis Date   . CAD (coronary artery disease)     s/p MI 1991, stents 1996, 2003, 2005.   . Diabetes mellitus   . Hypothyroidism   . Osteoarthritis   . BPH (benign prostatic hypertrophy)     Past Surgical History  Procedure Laterality Date  . Thumb amputation      1970 Work accident--left thumb amputated  . Coronary angioplasty with stent placement      Cardiac stents 1996, 2003, 2005  . Elbow surgery      left ( tennis)  . Mastoidectomy      Radical mastoidectomy on right 1982 (infection)  . Repair knee ligament      Left knee repair after fall 2009  . Cataract extraction      B cataract surgery 2010    Family History  Problem Relation Age of Onset  . Cancer Neg Hx     no prostate or colon cancer    History   Social History  . Marital Status: Widowed    Spouse Name: N/A    Number of Children: N/A  . Years of Education: N/A   Occupational History  . retired- Passenger transport manager 2003    Social History Main Topics  . Smoking status: Former Games developer  .  Smokeless tobacco: Never Used  . Alcohol Use: No  . Drug Use: No  . Sexual Activity: Not on file   Other Topics Concern  . Not on file   Social History Narrative   Ronnie Case [place]'s brother.    Has living will.   Niece Ronnie Case has health care POA.   Would accept resuscitation but no prolonged life support.    Not sure about tube feeds            Review of Systems Weight is down 3# Sleeps reasonably well--- a lot at times (frequent naps in day) Sedentary due to knee problems Hearing problems    Objective:   Physical Exam  Constitutional: He appears well-developed and well-nourished. No distress.  Neck: Normal range of motion. Neck supple. No thyromegaly present.  Cardiovascular: Normal rate, regular rhythm, normal heart sounds and intact distal pulses.  Exam reveals no gallop.   No murmur heard. Pulmonary/Chest: Effort normal and breath sounds normal. No respiratory distress. He has no wheezes. He has no rales.   Musculoskeletal: He exhibits no edema and no tenderness.  Lymphadenopathy:    He has no cervical adenopathy.  Skin:  Mycotic toenails No ulcers on foot  Psychiatric: He has a normal mood and affect. His behavior is normal.          Assessment & Plan:

## 2013-04-30 NOTE — Assessment & Plan Note (Signed)
Discussed using acetaminophen and then doing more walking

## 2013-04-30 NOTE — Patient Instructions (Signed)
Please try acetaminophen 650mg  and then try to walk and get more active.

## 2013-04-30 NOTE — Assessment & Plan Note (Signed)
Non compliant with diet and exercise Not willing to go for counseling---he knows what he should be doing Will add low dose glyburide if not improved

## 2013-05-17 ENCOUNTER — Other Ambulatory Visit: Payer: Self-pay | Admitting: *Deleted

## 2013-05-17 ENCOUNTER — Encounter: Payer: Self-pay | Admitting: *Deleted

## 2013-05-17 MED ORDER — GLIPIZIDE 5 MG PO TABS: 5.0000 mg | ORAL_TABLET | Freq: Every day | ORAL | Status: DC

## 2013-07-06 ENCOUNTER — Other Ambulatory Visit: Payer: Self-pay | Admitting: Internal Medicine

## 2013-07-21 ENCOUNTER — Other Ambulatory Visit: Payer: Self-pay | Admitting: Internal Medicine

## 2013-08-05 ENCOUNTER — Emergency Department (HOSPITAL_COMMUNITY): Payer: Medicare Other

## 2013-08-05 ENCOUNTER — Encounter (HOSPITAL_COMMUNITY): Payer: Self-pay | Admitting: Emergency Medicine

## 2013-08-05 ENCOUNTER — Inpatient Hospital Stay (HOSPITAL_COMMUNITY)
Admission: EM | Admit: 2013-08-05 | Discharge: 2013-08-08 | DRG: 638 | Disposition: A | Discharge status: Skilled Nursing Facility | Payer: Medicare Other | Attending: Family Medicine | Admitting: Family Medicine

## 2013-08-05 DIAGNOSIS — M6282 Rhabdomyolysis: Secondary | ICD-10-CM

## 2013-08-05 DIAGNOSIS — M1710 Unilateral primary osteoarthritis, unspecified knee: Secondary | ICD-10-CM | POA: Diagnosis present

## 2013-08-05 DIAGNOSIS — Z794 Long term (current) use of insulin: Secondary | ICD-10-CM

## 2013-08-05 DIAGNOSIS — I2510 Atherosclerotic heart disease of native coronary artery without angina pectoris: Secondary | ICD-10-CM

## 2013-08-05 DIAGNOSIS — E785 Hyperlipidemia, unspecified: Secondary | ICD-10-CM

## 2013-08-05 DIAGNOSIS — E1110 Type 2 diabetes mellitus with ketoacidosis without coma: Secondary | ICD-10-CM

## 2013-08-05 DIAGNOSIS — E1310 Other specified diabetes mellitus with ketoacidosis without coma: Principal | ICD-10-CM | POA: Diagnosis present

## 2013-08-05 DIAGNOSIS — Y92009 Unspecified place in unspecified non-institutional (private) residence as the place of occurrence of the external cause: Secondary | ICD-10-CM

## 2013-08-05 DIAGNOSIS — S68019A Complete traumatic metacarpophalangeal amputation of unspecified thumb, initial encounter: Secondary | ICD-10-CM

## 2013-08-05 DIAGNOSIS — Z87891 Personal history of nicotine dependence: Secondary | ICD-10-CM

## 2013-08-05 DIAGNOSIS — E860 Dehydration: Secondary | ICD-10-CM

## 2013-08-05 DIAGNOSIS — N183 Chronic kidney disease, stage 3 unspecified: Secondary | ICD-10-CM

## 2013-08-05 DIAGNOSIS — R739 Hyperglycemia, unspecified: Secondary | ICD-10-CM

## 2013-08-05 DIAGNOSIS — IMO0001 Reserved for inherently not codable concepts without codable children: Secondary | ICD-10-CM

## 2013-08-05 DIAGNOSIS — Z9119 Patient's noncompliance with other medical treatment and regimen: Secondary | ICD-10-CM

## 2013-08-05 DIAGNOSIS — Z79899 Other long term (current) drug therapy: Secondary | ICD-10-CM

## 2013-08-05 DIAGNOSIS — H9190 Unspecified hearing loss, unspecified ear: Secondary | ICD-10-CM

## 2013-08-05 DIAGNOSIS — I129 Hypertensive chronic kidney disease with stage 1 through stage 4 chronic kidney disease, or unspecified chronic kidney disease: Secondary | ICD-10-CM | POA: Diagnosis present

## 2013-08-05 DIAGNOSIS — I252 Old myocardial infarction: Secondary | ICD-10-CM

## 2013-08-05 DIAGNOSIS — Z91199 Patient's noncompliance with other medical treatment and regimen due to unspecified reason: Secondary | ICD-10-CM

## 2013-08-05 DIAGNOSIS — N059 Unspecified nephritic syndrome with unspecified morphologic changes: Secondary | ICD-10-CM | POA: Diagnosis present

## 2013-08-05 DIAGNOSIS — M1990 Unspecified osteoarthritis, unspecified site: Secondary | ICD-10-CM

## 2013-08-05 DIAGNOSIS — N400 Enlarged prostate without lower urinary tract symptoms: Secondary | ICD-10-CM

## 2013-08-05 DIAGNOSIS — E039 Hypothyroidism, unspecified: Secondary | ICD-10-CM | POA: Diagnosis present

## 2013-08-05 DIAGNOSIS — W19XXXS Unspecified fall, sequela: Secondary | ICD-10-CM

## 2013-08-05 DIAGNOSIS — W19XXXA Unspecified fall, initial encounter: Secondary | ICD-10-CM

## 2013-08-05 DIAGNOSIS — Z7982 Long term (current) use of aspirin: Secondary | ICD-10-CM

## 2013-08-05 DIAGNOSIS — W010XXA Fall on same level from slipping, tripping and stumbling without subsequent striking against object, initial encounter: Secondary | ICD-10-CM | POA: Diagnosis present

## 2013-08-05 DIAGNOSIS — E0590 Thyrotoxicosis, unspecified without thyrotoxic crisis or storm: Secondary | ICD-10-CM

## 2013-08-05 DIAGNOSIS — Z9861 Coronary angioplasty status: Secondary | ICD-10-CM

## 2013-08-05 DIAGNOSIS — Z0000 Encounter for general adult medical examination without abnormal findings: Secondary | ICD-10-CM

## 2013-08-05 HISTORY — DX: Acute myocardial infarction, unspecified: I21.9

## 2013-08-05 LAB — URINALYSIS, ROUTINE W REFLEX MICROSCOPIC
Bilirubin Urine: NEGATIVE
Glucose, UA: >1000 mg/dL — AB
Hgb urine dipstick: NEGATIVE
Ketones, ur: 40 mg/dL — AB
pH: 5 (ref 5.0–8.0)

## 2013-08-05 LAB — BASIC METABOLIC PANEL
BUN: 24 mg/dL — ABNORMAL HIGH (ref 6–23)
BUN: 25 mg/dL — ABNORMAL HIGH (ref 6–23)
CO2: 24 mEq/L (ref 19–32)
CO2: 25 mEq/L (ref 19–32)
Chloride: 92 mEq/L — ABNORMAL LOW (ref 96–112)
Chloride: 96 mEq/L (ref 96–112)
Creatinine, Ser: 1.41 mg/dL — ABNORMAL HIGH (ref 0.50–1.35)
GFR calc Af Amer: 59 mL/min — ABNORMAL LOW (ref >90)
GFR calc non Af Amer: 51 mL/min — ABNORMAL LOW (ref >90)
Glucose, Bld: 429 mg/dL — ABNORMAL HIGH (ref 70–99)
Potassium: 4.9 mEq/L (ref 3.5–5.1)
Potassium: 4.7 mEq/L (ref 3.5–5.1)
Sodium: 135 mEq/L (ref 135–145)

## 2013-08-05 LAB — CBC WITH DIFFERENTIAL/PLATELET
Basophils Relative: 0 % (ref 0–1)
Eosinophils Relative: 0 % (ref 0–5)
HCT: 46.1 % (ref 39.0–52.0)
Hemoglobin: 16 g/dL (ref 13.0–17.0)
Lymphocytes Relative: 5 % — ABNORMAL LOW (ref 12–46)
MCHC: 34.7 g/dL (ref 30.0–36.0)
MCV: 90.2 fL (ref 78.0–100.0)
Monocytes Absolute: 0.8 10*3/uL (ref 0.1–1.0)
Monocytes Relative: 7 % (ref 3–12)
Neutro Abs: 9.9 10*3/uL — ABNORMAL HIGH (ref 1.7–7.7)
Neutrophils Relative %: 88 % — ABNORMAL HIGH (ref 43–77)
RBC: 5.11 MIL/uL (ref 4.22–5.81)
RDW: 13.1 % (ref 11.5–15.5)

## 2013-08-05 LAB — GLUCOSE, CAPILLARY
Glucose-Capillary: 468 mg/dL — ABNORMAL HIGH (ref 70–99)
Glucose-Capillary: 382 mg/dL — ABNORMAL HIGH (ref 70–99)
Glucose-Capillary: 361 mg/dL — ABNORMAL HIGH (ref 70–99)
Glucose-Capillary: 307 mg/dL — ABNORMAL HIGH (ref 70–99)
Glucose-Capillary: 327 mg/dL — ABNORMAL HIGH (ref 70–99)
Glucose-Capillary: 293 mg/dL — ABNORMAL HIGH (ref 70–99)
Glucose-Capillary: 237 mg/dL — ABNORMAL HIGH (ref 70–99)

## 2013-08-05 LAB — POCT I-STAT TROPONIN I: Troponin i, poc: 0.01 ng/mL (ref 0.00–0.08)

## 2013-08-05 MED ORDER — DEXTROSE 50 % IV SOLN: 25.0000 mL | INTRAVENOUS | Status: DC | PRN

## 2013-08-05 MED ORDER — SODIUM CHLORIDE 0.9 % IV SOLN
INTRAVENOUS | Status: DC
  Administered 2013-08-05: 3.2 [IU]/h via INTRAVENOUS
  Filled 2013-08-05: qty 1

## 2013-08-05 MED ORDER — SODIUM CHLORIDE 0.9 % IJ SOLN
3.0000 mL | Freq: Two times a day (BID) | INTRAMUSCULAR | Status: DC
  Administered 2013-08-06 (×2): 3 mL via INTRAVENOUS

## 2013-08-05 MED ORDER — ONDANSETRON HCL 4 MG PO TABS: 4.0000 mg | ORAL_TABLET | Freq: Four times a day (QID) | ORAL | Status: DC | PRN

## 2013-08-05 MED ORDER — ONDANSETRON HCL 4 MG/2ML IJ SOLN: 4.0000 mg | Freq: Four times a day (QID) | INTRAMUSCULAR | Status: DC | PRN

## 2013-08-05 MED ORDER — NITROGLYCERIN 0.4 MG SL SUBL: 0.4000 mg | SUBLINGUAL_TABLET | SUBLINGUAL | Status: DC | PRN

## 2013-08-05 MED ORDER — SODIUM CHLORIDE 0.9 % IV BOLUS (SEPSIS)
1000.0000 mL | Freq: Once | INTRAVENOUS | Status: AC
  Administered 2013-08-05: 1000 mL via INTRAVENOUS

## 2013-08-05 MED ORDER — SODIUM CHLORIDE 0.9 % IV SOLN
INTRAVENOUS | Status: DC
  Filled 2013-08-05: qty 1

## 2013-08-05 MED ORDER — LOSARTAN POTASSIUM 50 MG PO TABS
50.0000 mg | ORAL_TABLET | Freq: Every day | ORAL | Status: DC
  Administered 2013-08-05 – 2013-08-08 (×4): 50 mg via ORAL
  Filled 2013-08-05 (×4): qty 1

## 2013-08-05 MED ORDER — SODIUM CHLORIDE 0.9 % IV SOLN
INTRAVENOUS | Status: DC
  Administered 2013-08-05 – 2013-08-08 (×5): via INTRAVENOUS

## 2013-08-05 MED ORDER — OMEGA-3 FATTY ACIDS 1000 MG PO CAPS: 1.0000 g | ORAL_CAPSULE | Freq: Every day | ORAL | Status: DC

## 2013-08-05 MED ORDER — INSULIN REGULAR BOLUS VIA INFUSION
0.0000 [IU] | Freq: Three times a day (TID) | INTRAVENOUS | Status: DC
  Filled 2013-08-05: qty 10

## 2013-08-05 MED ORDER — METOPROLOL TARTRATE 25 MG PO TABS
25.0000 mg | ORAL_TABLET | Freq: Two times a day (BID) | ORAL | Status: DC
  Administered 2013-08-05 – 2013-08-08 (×6): 25 mg via ORAL
  Filled 2013-08-05 (×7): qty 1

## 2013-08-05 MED ORDER — OMEGA-3-ACID ETHYL ESTERS 1 G PO CAPS
1.0000 g | ORAL_CAPSULE | Freq: Every day | ORAL | Status: DC
  Administered 2013-08-05 – 2013-08-08 (×4): 1 g via ORAL
  Filled 2013-08-05 (×4): qty 1

## 2013-08-05 MED ORDER — ACETAMINOPHEN 325 MG PO TABS: 650.0000 mg | ORAL_TABLET | Freq: Four times a day (QID) | ORAL | Status: DC | PRN

## 2013-08-05 MED ORDER — ACETAMINOPHEN 650 MG RE SUPP: 650.0000 mg | Freq: Four times a day (QID) | RECTAL | Status: DC | PRN

## 2013-08-05 MED ORDER — ASPIRIN 81 MG PO CHEW
81.0000 mg | CHEWABLE_TABLET | Freq: Every day | ORAL | Status: DC
  Administered 2013-08-05 – 2013-08-08 (×4): 81 mg via ORAL
  Filled 2013-08-05 (×5): qty 1

## 2013-08-05 MED ORDER — ALBUTEROL SULFATE (2.5 MG/3ML) 0.083% IN NEBU: 2.5000 mg | INHALATION_SOLUTION | RESPIRATORY_TRACT | Status: DC | PRN

## 2013-08-05 MED ORDER — ENOXAPARIN SODIUM 40 MG/0.4ML ~~LOC~~ SOLN
40.0000 mg | SUBCUTANEOUS | Status: DC
  Administered 2013-08-05 – 2013-08-07 (×3): 40 mg via SUBCUTANEOUS
  Filled 2013-08-05 (×4): qty 0.4

## 2013-08-05 MED ORDER — DEXTROSE-NACL 5-0.45 % IV SOLN
INTRAVENOUS | Status: DC
  Administered 2013-08-05: 22:00:00 via INTRAVENOUS

## 2013-08-05 NOTE — ED Notes (Signed)
Dr. Horton at the bedside.  

## 2013-08-05 NOTE — H&P (Addendum)
[facility]  History and Physical  Ronnie Case ZOX:[id number] DOB: 1941/02/17 DOA: 08/05/2013  Referring physician: EDP PCP: Tillman Abide, MD  Outpatient Specialists:  1. None  Chief Complaint: Fall  HPI: Ronnie Case is a 72 y.o. male with history of poorly controlled DM 2, nephropathy, CAD status post stents, hypothyroidism, hard of hearing, presented to the ED following the fall at home. Patient states that he was in his usual state of health until 10 PM on 08/04/13 when he tried to get up from his chair in the living room, felt dizzy and fell on the carpeted floor. He denies vertigo, headache, chest pain, palpitations or dyspnea. He denies loss of consciousness or any injuries. Due to weakness, he was unable to get up and stayed on the carpeted floor for approximately 12 hours. He was then able to crawl to his phone and call 911. He does give history of dizziness at times in the past. He denies slurred speech, facial twisting or asymmetrical weakness. EMS found him on the floor. He also vomited x3 which was nonbloody. In the ED, initial blood glucose was 429, creatinine 1.41 and initial anion gap was 18 even though bicarbonate was 24, WBC 11.3 and CK of 572. CT head and neck without acute findings. Chest x-ray suggests low-grade pulmonary interstitial edema. Hospitalist admission requested. Nephew at bedside and indicates that patient is noncompliant with his diet.   Review of Systems: All systems reviewed and apart from history of presenting illness, are negative.  Past Medical History  Diagnosis Date  . CAD (coronary artery disease)     s/p MI 1991, stents 1996, 2003, 2005.   . Diabetes mellitus   . Hypothyroidism   . Osteoarthritis   . BPH (benign prostatic hypertrophy)   . MI (myocardial infarction)    Past Surgical History  Procedure Laterality Date  . Thumb amputation      1970 Work accident--left thumb amputated  . Coronary angioplasty with stent placement       Cardiac stents 1996, 2003, 2005  . Elbow surgery      left ( tennis)  . Mastoidectomy      Radical mastoidectomy on right 1982 (infection)  . Repair knee ligament      Left knee repair after fall 2009  . Cataract extraction      B cataract surgery 2010   Social History:  reports that he has quit smoking. He has never used smokeless tobacco. He reports that he does not drink alcohol or use illicit drugs. Widowed. Lives alone and is independent of activities of daily living.  Allergies  Allergen Reactions  . Ofloxacin     REACTION: prostate swelling mouth swelling    Family History  Problem Relation Age of Onset  . Cancer Neg Hx     no prostate or colon cancer    Prior to Admission medications   Medication Sig Start Date End Date Taking? Authorizing Provider  aspirin 81 MG tablet Take 81 mg by mouth daily.     Yes Historical Provider, MD  fish oil-omega-3 fatty acids 1000 MG capsule Take 1 g by mouth daily.     Yes Historical Provider, MD  glipiZIDE (GLUCOTROL) 5 MG tablet Take 1 tablet (5 mg total) by mouth daily. 05/17/13  Yes Karie Schwalbe, MD  insulin NPH-regular (HUMULIN 70/30) (70-30) 100 UNIT/ML injection Inject 20-50 Units into the skin 3 (three) times daily before meals. Take 50 units before breakfast, 30 units before lunch, and 20 units  before dinner.   Yes Historical Provider, MD  losartan (COZAAR) 50 MG tablet TAKE 1 TABLET BY MOUTH EVERY DAY 10/30/12  Yes Karie Schwalbe, MD  metFORMIN (GLUCOPHAGE) 1000 MG tablet TAKE 2 TABLETS BY MOUTH EVERY DAY 02/24/13  Yes Karie Schwalbe, MD  metoprolol tartrate (LOPRESSOR) 25 MG tablet Take 1 tablet (25 mg total) by mouth 2 (two) times daily. 10/25/12  Yes Karie Schwalbe, MD  Multiple Vitamins-Minerals (ONE-A-DAY 50 PLUS PO) Take by mouth daily.     Yes Historical Provider, MD  nitroGLYCERIN (NITROSTAT) 0.4 MG SL tablet Place 1 tablet (0.4 mg total) under the tongue every 5 (five) minutes as needed. 10/24/12  Yes Karie Schwalbe, MD  simvastatin (ZOCOR) 40 MG tablet TAKE 1 TABLET BY MOUTH EVERY NIGHT AT BEDTIME 07/06/13  Yes Karie Schwalbe, MD  triamterene-hydrochlorothiazide (MAXZIDE-25) 37.5-25 MG per tablet TAKE 1 TABLET BY MOUTH EVERY DAY 07/06/13  Yes Karie Schwalbe, MD  ACCU-CHEK COMPACT TEST DRUM test strip TEST BLOOD SUGAR THREE TIMES DAILY 07/14/12   Karie Schwalbe, MD   Physical Exam: Filed Vitals:   08/05/13 1515 08/05/13 1530 08/05/13 1545 08/05/13 1644  BP: 126/59 125/59 127/59 134/65  Pulse: 88 91 91 96  Temp:    98.8 F (37.1 C)  TempSrc:    Oral  Resp: 25 18 22 20   Height:      Weight:      SpO2: 95% 97% 97% 97%     General exam: Moderately built and obese male patient, lying comfortably supine in bed in no obvious distress.  Head, eyes and ENT: Nontraumatic and normocephalic. Pupils equally reacting to light and accommodation. Oral mucosa with borderline hydration.  Neck: Supple. No JVD, carotid bruit or thyromegaly.  Lymphatics: No lymphadenopathy.  Respiratory system: Slightly reduced breath sounds in the bases with occasional basal crackles. Rest of lung fields clear to auscultation without wheezing or rhonchi. No increased work of breathing.  Cardiovascular system: S1 and S2 heard, RRR. No JVD, murmurs, gallops, clicks or pedal edema.  Gastrointestinal system: Abdomen is nondistended, soft and nontender. Normal bowel sounds heard. No organomegaly or masses appreciated.  Central nervous system: Alert and oriented. No focal neurological deficits. Hard of hearing.  Extremities: Symmetric 5 x 5 power. Peripheral pulses symmetrically felt.   Skin: No rashes or acute findings.  Musculoskeletal system: Negative exam.  Psychiatry: Pleasant and cooperative.   Labs on Admission:  Basic Metabolic Panel:  Recent Labs Lab 08/05/13 1147 08/05/13 1503  NA 134* 135  K 4.9 4.7  CL 92* 96  CO2 24 25  GLUCOSE 429* 399*  BUN 24* 25*  CREATININE 1.41* 1.33  CALCIUM 9.1  8.7   Liver Function Tests: No results found for this basename: AST, ALT, ALKPHOS, BILITOT, PROT, ALBUMIN,  in the last 168 hours No results found for this basename: LIPASE, AMYLASE,  in the last 168 hours No results found for this basename: AMMONIA,  in the last 168 hours CBC:  Recent Labs Lab 08/05/13 1147  WBC 11.3*  NEUTROABS 9.9*  HGB 16.0  HCT 46.1  MCV 90.2  PLT 159   Cardiac Enzymes:  Recent Labs Lab 08/05/13 1147  CKTOTAL 572*    BNP (last 3 results) No results found for this basename: PROBNP,  in the last 8760 hours CBG:  Recent Labs Lab 08/05/13 1351 08/05/13 1511 08/05/13 1625  GLUCAP 468* 382* 361*    Radiological Exams on Admission: Ct Head Wo Contrast  08/05/2013   CLINICAL DATA:  Dizziness then fell last night, generalized weakness, history diabetes, coronary artery disease post MI  EXAM: CT HEAD WITHOUT CONTRAST  CT CERVICAL SPINE WITHOUT CONTRAST  TECHNIQUE: Multidetector CT imaging of the head and cervical spine was performed following the standard protocol without intravenous contrast. Multiplanar CT image reconstructions of the cervical spine were also generated.  COMPARISON:  None  FINDINGS: CT HEAD FINDINGS  Mild atrophy.  Normal ventricular morphology.  No midline shift or mass effect.  Minimal small vessel chronic ischemic changes of deep cerebral white matter.  No intracranial hemorrhage, mass lesion, or evidence of acute infarction.  No extra-axial fluid collections.  No acute osseous findings. Mucosal thickening in bilateral ethmoid air cells with air-fluid level and central high attenuation within left maxillary sinus; cannot exclude hemorrhage though no definite facial bone fracture is evident.  CT CERVICAL SPINE FINDINGS  Visualized skullbase intact.  Scattered degenerative facet disease changes of the cervical spine.  Scattered disc space narrowing and minimal endplate spur formation throughout cervical spine.  Prevertebral soft tissues normal  thickness.  Vertebral body heights maintained without fracture or subluxation.  Asymmetric thyroid lobes, larger on left, though no discrete mass is visualized.  No acute osseous abnormalities identified.  IMPRESSION: No acute intracranial abnormalities.  Question left maxillary sinus disease with central high attenuation within the opacified left maxillary sinus which could represent developing calcification or blood.  Degenerative disc and facet disease changes of the cervical spine.  No acute cervical spine abnormalities.   Electronically Signed   By: Ulyses Southward M.D.   On: 08/05/2013 12:30   Ct Cervical Spine Wo Contrast  08/05/2013   CLINICAL DATA:  Dizziness then fell last night, generalized weakness, history diabetes, coronary artery disease post MI  EXAM: CT HEAD WITHOUT CONTRAST  CT CERVICAL SPINE WITHOUT CONTRAST  TECHNIQUE: Multidetector CT imaging of the head and cervical spine was performed following the standard protocol without intravenous contrast. Multiplanar CT image reconstructions of the cervical spine were also generated.  COMPARISON:  None  FINDINGS: CT HEAD FINDINGS  Mild atrophy.  Normal ventricular morphology.  No midline shift or mass effect.  Minimal small vessel chronic ischemic changes of deep cerebral white matter.  No intracranial hemorrhage, mass lesion, or evidence of acute infarction.  No extra-axial fluid collections.  No acute osseous findings. Mucosal thickening in bilateral ethmoid air cells with air-fluid level and central high attenuation within left maxillary sinus; cannot exclude hemorrhage though no definite facial bone fracture is evident.  CT CERVICAL SPINE FINDINGS  Visualized skullbase intact.  Scattered degenerative facet disease changes of the cervical spine.  Scattered disc space narrowing and minimal endplate spur formation throughout cervical spine.  Prevertebral soft tissues normal thickness.  Vertebral body heights maintained without fracture or subluxation.   Asymmetric thyroid lobes, larger on left, though no discrete mass is visualized.  No acute osseous abnormalities identified.  IMPRESSION: No acute intracranial abnormalities.  Question left maxillary sinus disease with central high attenuation within the opacified left maxillary sinus which could represent developing calcification or blood.  Degenerative disc and facet disease changes of the cervical spine.  No acute cervical spine abnormalities.   Electronically Signed   By: Ulyses Southward M.D.   On: 08/05/2013 12:30   Dg Chest Portable 1 View  08/05/2013   CLINICAL DATA:  Weakness, history MI and placement of coronary artery DIS dense  EXAM: PORTABLE CHEST - 1 VIEW  FINDINGS: The lungs are mildly hypoinflated.  The interstitial markings are prominent bilaterally. There is no pleural effusion or pneumothorax. The cardiopericardial silhouette is mildly enlarged. The central pulmonary vascularity is prominent. The trachea is midline. Calcified lymph nodes are present in the mediastinum. The observed portions of the bony thorax appear normal.  IMPRESSION: The findings suggest low-grade pulmonary interstitial edema. There is no focal pneumonia nor significant pleural effusion. The findings are accentuated by mild bilateral pulmonary hypo inflation. When the patient can tolerate the procedure, a PA and lateral chest x-ray would be of value.   Electronically Signed   By: David  [place]   On: 08/05/2013 12:46    EKG: Independently reviewed. Sinus rhythm, LAD, slow R wave progression. No acute findings.  Assessment/Plan Principal Problem:   DKA, type 2, not at goal Active Problems:   HYPERTHYROIDISM   HYPERLIPIDEMIA   DECREASED HEARING   CORONARY ARTERY DISEASE   BENIGN PROSTATIC HYPERTROPHY   Dehydration   Fall at home   Chronic kidney disease (CKD), stage III (moderate)   1. Poorly controlled type II DM with mild DKA: Admitted to telemetry. Patient was bolused with a liter of IV fluids in the ED,  started on maintenance fluid and glucose stabilizer insulin. His anion gap is already reduced to 14. His CBGs is still in the 360 mg per DL range. We'll not initiate DKA protocol since he is improving on current regimen. Continue glucose stabilizer and brief gentle IV fluid hydration. When his blood sugars are better, transitioned to Lantus and SSI. Patient is on mixed split insulin set home and states that he takes it twice a day and claims compliant with that regimen. His last hemoglobin A1c in September was >10 suggesting poor control. This is likely secondary to noncompliance to diet. He states that he has not used Lantus before but his wife was on it. He is reluctant to start Lantus unless discussed with his PCP. Please discuss with his PCP on 12/29 AM regarding switching him to Lantus, NovoLog mealtime which might be a better regimen for him.  2. Dehydration: Brief IV fluid hydration.  3. Mild rhabdomyolysis: Likely secondary to fall and laying on the floor for extended period of time. Brief IV fluid hydration and follow CK in a.m. 4. Mild acute on stage III chronic kidney disease: Improving. Brief IV fluids. 5. Hypertension: Controlled. Continue Cozaar and metoprolol. Hold diuretics temporarily. 6. CAD: Asymptomatic of chest pain and no acute EKG changes.  7. Hyperlipidemia: Hold statin secondary to mild rhabdomyolysis. 8. Fall,? Unsteady gait at home secondary to osteoarthritis of knees: PT evaluation. Also check orthostatic blood pressures.    Code Status: Full  Family Communication: Discussed with nephew at bedside.  Disposition Plan: Home in medically stable   Time spent: 60 minutes  Schneur Crowson, MD, FACP, FHM. [facility] Pager [phone]  If 7PM-7AM, please contact night-coverage [email or web address] Password [email or web address] 08/05/2013, 6:01 PM

## 2013-08-05 NOTE — ED Notes (Signed)
Per [facility], pt lives by self. Fell last night sometime. Uses no assistive device. States he was dizzy before he fell. Denies any LOC or pain. Has generalized weakness. Pt is diabetic with CBG of 399. Does have dried vomitus around mouth. Has vomited 3x after midnight. 18g to LFA. 91 % on RA

## 2013-08-05 NOTE — ED Notes (Signed)
Patient transported to CT 

## 2013-08-05 NOTE — ED Provider Notes (Signed)
CSN: [id number]     Arrival date & time 08/05/13  1101 History   First MD Initiated Contact with Patient 08/05/13 1107     Chief Complaint  Patient presents with  . Weakness   (Consider location/radiation/quality/duration/timing/severity/associated sxs/prior Treatment) HPI  This is a 72 are old male who presents following a fall. Patient reports a mechanical fall last night approximately 10 PM. Patient states that he stood up from sitting walking across the room and felt dizzy, tripped over his feet and fell. He denies loss of consciousness or pain. He reports feeling off balance but no room spinning dizziness. He reports prior episodes of this in the past. Patient was unable to get himself off the floor. He denies any focal weakness or numbness but states that "I just couldn't get up." He was found by EMS late this morning on the floor. He had vomited 3 times during the night. Blood glucose was 399.  Past Medical History  Diagnosis Date  . CAD (coronary artery disease)     s/p MI 1991, stents 1996, 2003, 2005.   . Diabetes mellitus   . Hypothyroidism   . Osteoarthritis   . BPH (benign prostatic hypertrophy)   . MI (myocardial infarction)    Past Surgical History  Procedure Laterality Date  . Thumb amputation      1970 Work accident--left thumb amputated  . Coronary angioplasty with stent placement      Cardiac stents 1996, 2003, 2005  . Elbow surgery      left ( tennis)  . Mastoidectomy      Radical mastoidectomy on right 1982 (infection)  . Repair knee ligament      Left knee repair after fall 2009  . Cataract extraction      B cataract surgery 2010   Family History  Problem Relation Age of Onset  . Cancer Neg Hx     no prostate or colon cancer   History  Substance Use Topics  . Smoking status: Former Games developer  . Smokeless tobacco: Never Used  . Alcohol Use: No    Review of Systems  Constitutional: Negative.  Negative for fever.  Respiratory: Negative.  Negative  for chest tightness and shortness of breath.   Cardiovascular: Negative.  Negative for chest pain.  Gastrointestinal: Positive for nausea and vomiting. Negative for abdominal pain.  Genitourinary: Negative.  Negative for dysuria.  Musculoskeletal: Negative for back pain.  Skin: Negative for rash.  Neurological: Positive for dizziness. Negative for speech difficulty, weakness, numbness and headaches.  All other systems reviewed and are negative.    Allergies  Ofloxacin  Home Medications   No current outpatient prescriptions on file. BP 134/65  Pulse 96  Temp(Src) 98.8 F (37.1 C) (Oral)  Resp 20  Ht 6\' 3"  (1.905 m)  Wt 245 lb (111.131 kg)  BMI 30.62 kg/m2  SpO2 97% Physical Exam  Nursing note and vitals reviewed. Constitutional: He is oriented to person, place, and time. No distress.  Elderly, dried vomit over the face  HENT:  Head: Normocephalic and atraumatic.  Mucous membranes dry  Eyes: Pupils are equal, round, and reactive to light.  Neck: Normal range of motion. Neck supple.  No C-spine tenderness  Cardiovascular: Normal rate, regular rhythm and normal heart sounds.   No murmur heard. Pulmonary/Chest: Effort normal. No respiratory distress. He has no wheezes. He has rales.  Abdominal: Soft. Bowel sounds are normal. There is no tenderness. There is no rebound.  Musculoskeletal: Normal range of motion. He  exhibits no edema.  Normal range of motion of the bilateral hips and bilateral upper Trinity's, no obvious deformities  Lymphadenopathy:    He has no cervical adenopathy.  Neurological: He is alert and oriented to person, place, and time.  Coordination intact to finger-nose-finger, 5 out of 5 strength in all 4 extremities, fluent speech  Skin: Skin is warm and dry.  Psychiatric: He has a normal mood and affect.    ED Course  Procedures (including critical care time) Labs Review Labs Reviewed  CBC WITH DIFFERENTIAL - Abnormal; Notable for the following:     WBC 11.3 (*)    Neutrophils Relative % 88 (*)    Neutro Abs 9.9 (*)    Lymphocytes Relative 5 (*)    Lymphs Abs 0.6 (*)    All other components within normal limits  BASIC METABOLIC PANEL - Abnormal; Notable for the following:    Sodium 134 (*)    Chloride 92 (*)    Glucose, Bld 429 (*)    BUN 24 (*)    Creatinine, Ser 1.41 (*)    GFR calc non Af Amer 48 (*)    GFR calc Af Amer 55 (*)    All other components within normal limits  URINALYSIS, ROUTINE W REFLEX MICROSCOPIC - Abnormal; Notable for the following:    Glucose, UA >1000 (*)    Ketones, ur 40 (*)    All other components within normal limits  CK - Abnormal; Notable for the following:    Total CK 572 (*)    All other components within normal limits  GLUCOSE, CAPILLARY - Abnormal; Notable for the following:    Glucose-Capillary 468 (*)    All other components within normal limits  BASIC METABOLIC PANEL - Abnormal; Notable for the following:    Glucose, Bld 399 (*)    BUN 25 (*)    GFR calc non Af Amer 51 (*)    GFR calc Af Amer 59 (*)    All other components within normal limits  GLUCOSE, CAPILLARY - Abnormal; Notable for the following:    Glucose-Capillary 382 (*)    All other components within normal limits  GLUCOSE, CAPILLARY - Abnormal; Notable for the following:    Glucose-Capillary 361 (*)    All other components within normal limits  GLUCOSE, CAPILLARY - Abnormal; Notable for the following:    Glucose-Capillary 307 (*)    All other components within normal limits  URINE MICROSCOPIC-ADD ON  HEMOGLOBIN A1C  BASIC METABOLIC PANEL  CK  POCT I-STAT TROPONIN I   Imaging Review Ct Head Wo Contrast  08/05/2013   CLINICAL DATA:  Dizziness then fell last night, generalized weakness, history diabetes, coronary artery disease post MI  EXAM: CT HEAD WITHOUT CONTRAST  CT CERVICAL SPINE WITHOUT CONTRAST  TECHNIQUE: Multidetector CT imaging of the head and cervical spine was performed following the standard protocol  without intravenous contrast. Multiplanar CT image reconstructions of the cervical spine were also generated.  COMPARISON:  None  FINDINGS: CT HEAD FINDINGS  Mild atrophy.  Normal ventricular morphology.  No midline shift or mass effect.  Minimal small vessel chronic ischemic changes of deep cerebral white matter.  No intracranial hemorrhage, mass lesion, or evidence of acute infarction.  No extra-axial fluid collections.  No acute osseous findings. Mucosal thickening in bilateral ethmoid air cells with air-fluid level and central high attenuation within left maxillary sinus; cannot exclude hemorrhage though no definite facial bone fracture is evident.  CT CERVICAL SPINE FINDINGS  Visualized skullbase intact.  Scattered degenerative facet disease changes of the cervical spine.  Scattered disc space narrowing and minimal endplate spur formation throughout cervical spine.  Prevertebral soft tissues normal thickness.  Vertebral body heights maintained without fracture or subluxation.  Asymmetric thyroid lobes, larger on left, though no discrete mass is visualized.  No acute osseous abnormalities identified.  IMPRESSION: No acute intracranial abnormalities.  Question left maxillary sinus disease with central high attenuation within the opacified left maxillary sinus which could represent developing calcification or blood.  Degenerative disc and facet disease changes of the cervical spine.  No acute cervical spine abnormalities.   Electronically Signed   By: Ulyses Southward M.D.   On: 08/05/2013 12:30   Ct Cervical Spine Wo Contrast  08/05/2013   CLINICAL DATA:  Dizziness then fell last night, generalized weakness, history diabetes, coronary artery disease post MI  EXAM: CT HEAD WITHOUT CONTRAST  CT CERVICAL SPINE WITHOUT CONTRAST  TECHNIQUE: Multidetector CT imaging of the head and cervical spine was performed following the standard protocol without intravenous contrast. Multiplanar CT image reconstructions of the  cervical spine were also generated.  COMPARISON:  None  FINDINGS: CT HEAD FINDINGS  Mild atrophy.  Normal ventricular morphology.  No midline shift or mass effect.  Minimal small vessel chronic ischemic changes of deep cerebral white matter.  No intracranial hemorrhage, mass lesion, or evidence of acute infarction.  No extra-axial fluid collections.  No acute osseous findings. Mucosal thickening in bilateral ethmoid air cells with air-fluid level and central high attenuation within left maxillary sinus; cannot exclude hemorrhage though no definite facial bone fracture is evident.  CT CERVICAL SPINE FINDINGS  Visualized skullbase intact.  Scattered degenerative facet disease changes of the cervical spine.  Scattered disc space narrowing and minimal endplate spur formation throughout cervical spine.  Prevertebral soft tissues normal thickness.  Vertebral body heights maintained without fracture or subluxation.  Asymmetric thyroid lobes, larger on left, though no discrete mass is visualized.  No acute osseous abnormalities identified.  IMPRESSION: No acute intracranial abnormalities.  Question left maxillary sinus disease with central high attenuation within the opacified left maxillary sinus which could represent developing calcification or blood.  Degenerative disc and facet disease changes of the cervical spine.  No acute cervical spine abnormalities.   Electronically Signed   By: Ulyses Southward M.D.   On: 08/05/2013 12:30   Dg Chest Portable 1 View  08/05/2013   CLINICAL DATA:  Weakness, history MI and placement of coronary artery DIS dense  EXAM: PORTABLE CHEST - 1 VIEW  FINDINGS: The lungs are mildly hypoinflated. The interstitial markings are prominent bilaterally. There is no pleural effusion or pneumothorax. The cardiopericardial silhouette is mildly enlarged. The central pulmonary vascularity is prominent. The trachea is midline. Calcified lymph nodes are present in the mediastinum. The observed portions of  the bony thorax appear normal.  IMPRESSION: The findings suggest low-grade pulmonary interstitial edema. There is no focal pneumonia nor significant pleural effusion. The findings are accentuated by mild bilateral pulmonary hypo inflation. When the patient can tolerate the procedure, a PA and lateral chest x-ray would be of value.   Electronically Signed   By: David  [place]   On: 08/05/2013 12:46    EKG Interpretation    Date/Time:  Sunday August 05 2013 11:07:45 EST Ventricular Rate:  78 PR Interval:  163 QRS Duration: 97 QT Interval:  376 QTC Calculation: 428 R Axis:   -65 Text Interpretation:  Age not entered, assumed to be  72 years old for purpose of ECG interpretation Sinus or ectopic atrial rhythm Left ventricular hypertrophy Anterolateral infarct, age indeterminate Confirmed by HORTON  MD, COURTNEY ([id number]) on 08/05/2013 5:11:01 PM            MDM   1. Fall at home, initial encounter   2. Hyperglycemia   3. Rhabdomyolysis    Patient presents after being found on the floor at home and not being able to get up.  Denies syncope or LOC.  Nontoxic on exam and no obvious injury. Nonfocal.  CT neg.  W/u notable for hyperglycemia with an anion gap of 18 and a CK of >500.  Started on [employer].  Given NS bolus.  Will admit for further management.    Shon Baton, MD 08/05/13 [phone]

## 2013-08-05 NOTE — ED Notes (Signed)
Pt returned from CT.  Placed back on 3L of O2.

## 2013-08-05 NOTE — ED Notes (Signed)
Insulin drip verified with Katherina Right.

## 2013-08-06 DIAGNOSIS — E860 Dehydration: Secondary | ICD-10-CM

## 2013-08-06 DIAGNOSIS — M6282 Rhabdomyolysis: Secondary | ICD-10-CM

## 2013-08-06 DIAGNOSIS — E1310 Other specified diabetes mellitus with ketoacidosis without coma: Principal | ICD-10-CM

## 2013-08-06 DIAGNOSIS — W19XXXA Unspecified fall, initial encounter: Secondary | ICD-10-CM

## 2013-08-06 DIAGNOSIS — Y92009 Unspecified place in unspecified non-institutional (private) residence as the place of occurrence of the external cause: Secondary | ICD-10-CM

## 2013-08-06 DIAGNOSIS — W19XXXS Unspecified fall, sequela: Secondary | ICD-10-CM

## 2013-08-06 LAB — CK
Total CK: 512 U/L — ABNORMAL HIGH (ref 7–232)
Total CK: 487 U/L — ABNORMAL HIGH (ref 7–232)

## 2013-08-06 LAB — BASIC METABOLIC PANEL
BUN: 25 mg/dL — ABNORMAL HIGH (ref 6–23)
CO2: 23 mEq/L (ref 19–32)
Chloride: 97 mEq/L (ref 96–112)
Creatinine, Ser: 1.24 mg/dL (ref 0.50–1.35)
GFR calc Af Amer: 64 mL/min — ABNORMAL LOW (ref >90)
Potassium: 4.1 mEq/L (ref 3.5–5.1)
Sodium: 136 mEq/L (ref 135–145)

## 2013-08-06 LAB — GLUCOSE, CAPILLARY
Glucose-Capillary: 153 mg/dL — ABNORMAL HIGH (ref 70–99)
Glucose-Capillary: 166 mg/dL — ABNORMAL HIGH (ref 70–99)
Glucose-Capillary: 114 mg/dL — ABNORMAL HIGH (ref 70–99)
Glucose-Capillary: 128 mg/dL — ABNORMAL HIGH (ref 70–99)
Glucose-Capillary: 291 mg/dL — ABNORMAL HIGH (ref 70–99)

## 2013-08-06 LAB — HEMOGLOBIN A1C
Hgb A1c MFr Bld: 10.1 % — ABNORMAL HIGH (ref <5.7)
Mean Plasma Glucose: 243 mg/dL — ABNORMAL HIGH (ref <117)

## 2013-08-06 LAB — TSH: TSH: 2.217 u[IU]/mL (ref 0.350–4.500)

## 2013-08-06 MED ORDER — LIVING WELL WITH DIABETES BOOK
Freq: Once | Status: AC
Start: 2013-08-06 — End: 2013-08-06
  Administered 2013-08-06: 18:00:00
  Filled 2013-08-06: qty 1

## 2013-08-06 MED ORDER — INSULIN ASPART 100 UNIT/ML ~~LOC~~ SOLN
0.0000 [IU] | Freq: Three times a day (TID) | SUBCUTANEOUS | Status: DC
  Administered 2013-08-06: 7 [IU] via SUBCUTANEOUS
  Administered 2013-08-06: 5 [IU] via SUBCUTANEOUS
  Administered 2013-08-06: 3 [IU] via SUBCUTANEOUS
  Administered 2013-08-07: 5 [IU] via SUBCUTANEOUS
  Administered 2013-08-07: 9 [IU] via SUBCUTANEOUS

## 2013-08-06 NOTE — Evaluation (Signed)
Physical Therapy Evaluation Patient Details Name: Ronnie Case MRN: [id number] DOB: 01-01-1941 Today's Date: 08/06/2013 Time: 4098-1191 PT Time Calculation (min): 18 min  PT Assessment / Plan / Recommendation History of Present Illness  72 y.o. male with history of poorly controlled DM 2, nephropathy, CAD status post stents, hypothyroidism, hard of hearing, presented to the ED following the fall at home. Patient states that he was in his usual state of health until 10 PM on 08/04/13 when he tried to get up from his chair in the living room, felt dizzy and fell on the carpeted floor. He denies vertigo, headache, chest pain, palpitations or dyspnea. He denies loss of consciousness or any injuries. Due to weakness, he was unable to get up and stayed on the carpeted floor for approximately 12 hours. He was then able to crawl to his phone and call 911. He does give history of dizziness at times in the past. He denies slurred speech, facial twisting or asymmetrical weakness. EMS found him on the floor. He also vomited x3 which was nonbloody. In the ED, initial blood glucose was 429, creatinine 1.41 and initial anion gap was 18 even though bicarbonate was 24, WBC 11.3 and CK of 572. CT head and neck without acute findings. Chest x-ray suggests low-grade pulmonary interstitial edema. Hospitalist admission requested. Nephew at bedside and indicates that patient is noncompliant with his diet.  Clinical Impression  Pt presents with impaired strength, gait, balance and mobility and will benefit from acute PT services to address deficits and increase functional independence.  Pt may benefit from SNF at d/c due to need for increased strengthening and fact that pt lives alone with no 24 hour assistance/supervision.    PT Assessment  Patient needs continued PT services    Follow Up Recommendations  SNF    Does the patient have the potential to tolerate intense rehabilitation      Barriers to Discharge  Decreased caregiver support;Inaccessible home environment lives alone, 4 stairs to enter    Equipment Recommendations  None recommended by PT    Recommendations for Other Services OT consult   Frequency Min 3X/week    Precautions / Restrictions Precautions Precautions: Fall Restrictions Weight Bearing Restrictions: No   Pertinent Vitals/Pain No c/o pain. Pt performed treatment session on room air, spO2 94%.  RN made aware.      Mobility  Bed Mobility Bed Mobility: Not assessed Details for Bed Mobility Assistance: pt seated on edge of bed upon PT entry Transfers Transfers: Sit to Stand;Stand to Sit;Stand Pivot Transfers Sit to Stand: 4: Min assist Stand to Sit: 4: Min assist Stand Pivot Transfers: 4: Min assist Details for Transfer Assistance: pt performed sit to stand and SPT to [facility] with min A.  Pt with forward flexed trunk in standing, unable to achieve full upright posture.  pt states this is normal for him. Ambulation/Gait Ambulation/Gait Assistance: 4: Min assist Ambulation Distance (Feet): 40 Feet Assistive device: None Ambulation/Gait Assistance Details: pt with forward flexed posture, wide BOS, toe out.  pt grabs onto furniture in room to assist. pt states he did not use assistive device at home and "was able to walk better"    Exercises     PT Diagnosis: Difficulty walking;Generalized weakness  PT Problem List: Decreased strength;Decreased activity tolerance;Decreased balance;Decreased mobility;Decreased knowledge of use of DME;Decreased safety awareness PT Treatment Interventions: Gait training;DME instruction;Patient/family education;Stair training;Wheelchair mobility training;Functional mobility training;Therapeutic activities;Modalities;Therapeutic exercise;Balance training;Neuromuscular re-education     PT Goals(Current goals can be found in the  care plan section) Acute Rehab PT Goals PT Goal Formulation: With patient Time For Goal Achievement:  08/20/13 Potential to Achieve Goals: Good  Visit Information  Last PT Received On: 08/06/13 Assistance Needed: +1 History of Present Illness: 72 y.o. male with history of poorly controlled DM 2, nephropathy, CAD status post stents, hypothyroidism, hard of hearing, presented to the ED following the fall at home. Patient states that he was in his usual state of health until 10 PM on 08/04/13 when he tried to get up from his chair in the living room, felt dizzy and fell on the carpeted floor. He denies vertigo, headache, chest pain, palpitations or dyspnea. He denies loss of consciousness or any injuries. Due to weakness, he was unable to get up and stayed on the carpeted floor for approximately 12 hours. He was then able to crawl to his phone and call 911. He does give history of dizziness at times in the past. He denies slurred speech, facial twisting or asymmetrical weakness. EMS found him on the floor. He also vomited x3 which was nonbloody. In the ED, initial blood glucose was 429, creatinine 1.41 and initial anion gap was 18 even though bicarbonate was 24, WBC 11.3 and CK of 572. CT head and neck without acute findings. Chest x-ray suggests low-grade pulmonary interstitial edema. Hospitalist admission requested. Nephew at bedside and indicates that patient is noncompliant with his diet.       Prior Functioning  Home Living Family/patient expects to be discharged to:: Private residence Living Arrangements: Alone Available Help at Discharge: Family;Available PRN/intermittently Type of Home: House Home Access: Stairs to enter [employer] of Steps: 4 Entrance Stairs-Rails: Left Home Layout: One level Home Equipment: Walker - 2 wheels;Cane - single point Prior Function Level of Independence: Independent Communication Communication: No difficulties    Cognition  Cognition Arousal/Alertness: Awake/alert Behavior During Therapy: WFL for tasks assessed/performed Overall Cognitive  Status: No family/caregiver present to determine baseline cognitive functioning    Extremity/Trunk Assessment Lower Extremity Assessment Lower Extremity Assessment: Generalized weakness Cervical / Trunk Assessment Cervical / Trunk Assessment: Kyphotic   Balance Static Standing Balance Static Standing - Balance Support: Right upper extremity supported Static Standing - Level of Assistance: 5: Stand by assistance Static Standing - Comment/# of Minutes: pt able to stand to perform hygiene after toileting with supervision with 1 UE support  End of Session PT - End of Session Equipment Utilized During Treatment: Gait belt Activity Tolerance: Patient tolerated treatment well Patient left: in bed;with call bell/phone within reach;with bed alarm set Nurse Communication: Mobility status  GP     Ceira Hoeschen 08/06/2013, 10:23 AM

## 2013-08-06 NOTE — Progress Notes (Signed)
As per Pt. He noticed some blood after he urinated this AM.Dr. Cena Benton was notified.Keep monitoring pt. Closely and assessing his needs.

## 2013-08-06 NOTE — Progress Notes (Signed)
[facility] PROGRESS NOTE  Ronnie Case ZOX:[id number] DOB: February 25, 1941 DOA: 08/05/2013 PCP: Tillman Abide, MD  Assessment/Plan: Principal Problem:   DKA, type 2, not at goal - resolved and patient currently off of insulin gtt - most likely contributed to overall weakness with prolonged down time leading to rhabdomyolysis  - Carb modified  Active Problems:   HYPERTHYROIDISM - TSH on 04/30/13 was WNL - will reassess TSH     HYPERLIPIDEMIA - currently on lovaza   CORONARY ARTERY DISEASE - stable, no chest pain complaints. Continue asa and lovaza   BENIGN PROSTATIC HYPERTROPHY   Dehydration - Most likely due to principal problem. Only with IV fluids and improved oral intake.    Fall at home - Suspect secondary to combination of dehydration and DKA. Physical therapy consult at plans will be for physical therapy at skilled nursing facility    Chronic kidney disease (CKD), stage III (moderate) - Stable. Serum creatinine trending down with IV fluid administration.  Rhabdomyolysis - Initial CK 572. Followup CK pending we'll continue to monitor CK levels but suspect continued decline in CK levels with IV fluid administration as well as improved oral intake. - Most likely cause of red tinged urine for myoglobin breakdown products.  Code Status: full Family Communication: no family at bedside. Disposition Plan: To SNF. Will consult social worker.   Consultants:  Physical therapist  Procedures:  None  Antibiotics:  None  HPI/Subjective: Patient noticed some blood in his urine. Patient states he currently feels better. Also mentions that he lives by himself  Objective: Filed Vitals:   08/06/13 0608  BP: 131/60  Pulse: 77  Temp:   Resp:     Intake/Output Summary (Last 24 hours) at 08/06/13 1043 Last data filed at 08/06/13 0900  Gross per 24 hour  Intake   1000 ml  Output    601 ml  Net    399 ml   Filed Weights   08/05/13 1111  Weight: 111.131 kg (245  lb)    Exam:   General:  Patient appearing disheveled, alert and awake  Cardiovascular: Regular rate and rhythm, no murmurs  Respiratory: No increased work of breathing, no audible wheezes, no rales  Abdomen: Soft, nontender, nondistended  Musculoskeletal: No cyanosis, no clubbing  Data Reviewed: Basic Metabolic Panel:  Recent Labs Lab 08/05/13 1147 08/05/13 1503  NA 134* 135  K 4.9 4.7  CL 92* 96  CO2 24 25  GLUCOSE 429* 399*  BUN 24* 25*  CREATININE 1.41* 1.33  CALCIUM 9.1 8.7   Liver Function Tests: No results found for this basename: AST, ALT, ALKPHOS, BILITOT, PROT, ALBUMIN,  in the last 168 hours No results found for this basename: LIPASE, AMYLASE,  in the last 168 hours No results found for this basename: AMMONIA,  in the last 168 hours CBC:  Recent Labs Lab 08/05/13 1147  WBC 11.3*  NEUTROABS 9.9*  HGB 16.0  HCT 46.1  MCV 90.2  PLT 159   Cardiac Enzymes:  Recent Labs Lab 08/05/13 1147  CKTOTAL 572*   BNP (last 3 results) No results found for this basename: PROBNP,  in the last 8760 hours CBG:  Recent Labs Lab 08/06/13 0102 08/06/13 0211 08/06/13 0314 08/06/13 0505 08/06/13 0735  GLUCAP 153* 166* 114* 128* 214*    No results found for this or any previous visit (from the past 240 hour(s)).   Studies: Ct Head Wo Contrast  08/05/2013   CLINICAL DATA:  Dizziness then fell last night, generalized weakness,  history diabetes, coronary artery disease post MI  EXAM: CT HEAD WITHOUT CONTRAST  CT CERVICAL SPINE WITHOUT CONTRAST  TECHNIQUE: Multidetector CT imaging of the head and cervical spine was performed following the standard protocol without intravenous contrast. Multiplanar CT image reconstructions of the cervical spine were also generated.  COMPARISON:  None  FINDINGS: CT HEAD FINDINGS  Mild atrophy.  Normal ventricular morphology.  No midline shift or mass effect.  Minimal small vessel chronic ischemic changes of deep cerebral white  matter.  No intracranial hemorrhage, mass lesion, or evidence of acute infarction.  No extra-axial fluid collections.  No acute osseous findings. Mucosal thickening in bilateral ethmoid air cells with air-fluid level and central high attenuation within left maxillary sinus; cannot exclude hemorrhage though no definite facial bone fracture is evident.  CT CERVICAL SPINE FINDINGS  Visualized skullbase intact.  Scattered degenerative facet disease changes of the cervical spine.  Scattered disc space narrowing and minimal endplate spur formation throughout cervical spine.  Prevertebral soft tissues normal thickness.  Vertebral body heights maintained without fracture or subluxation.  Asymmetric thyroid lobes, larger on left, though no discrete mass is visualized.  No acute osseous abnormalities identified.  IMPRESSION: No acute intracranial abnormalities.  Question left maxillary sinus disease with central high attenuation within the opacified left maxillary sinus which could represent developing calcification or blood.  Degenerative disc and facet disease changes of the cervical spine.  No acute cervical spine abnormalities.   Electronically Signed   By: Ulyses Southward M.D.   On: 08/05/2013 12:30   Ct Cervical Spine Wo Contrast  08/05/2013   CLINICAL DATA:  Dizziness then fell last night, generalized weakness, history diabetes, coronary artery disease post MI  EXAM: CT HEAD WITHOUT CONTRAST  CT CERVICAL SPINE WITHOUT CONTRAST  TECHNIQUE: Multidetector CT imaging of the head and cervical spine was performed following the standard protocol without intravenous contrast. Multiplanar CT image reconstructions of the cervical spine were also generated.  COMPARISON:  None  FINDINGS: CT HEAD FINDINGS  Mild atrophy.  Normal ventricular morphology.  No midline shift or mass effect.  Minimal small vessel chronic ischemic changes of deep cerebral white matter.  No intracranial hemorrhage, mass lesion, or evidence of acute  infarction.  No extra-axial fluid collections.  No acute osseous findings. Mucosal thickening in bilateral ethmoid air cells with air-fluid level and central high attenuation within left maxillary sinus; cannot exclude hemorrhage though no definite facial bone fracture is evident.  CT CERVICAL SPINE FINDINGS  Visualized skullbase intact.  Scattered degenerative facet disease changes of the cervical spine.  Scattered disc space narrowing and minimal endplate spur formation throughout cervical spine.  Prevertebral soft tissues normal thickness.  Vertebral body heights maintained without fracture or subluxation.  Asymmetric thyroid lobes, larger on left, though no discrete mass is visualized.  No acute osseous abnormalities identified.  IMPRESSION: No acute intracranial abnormalities.  Question left maxillary sinus disease with central high attenuation within the opacified left maxillary sinus which could represent developing calcification or blood.  Degenerative disc and facet disease changes of the cervical spine.  No acute cervical spine abnormalities.   Electronically Signed   By: Ulyses Southward M.D.   On: 08/05/2013 12:30   Dg Chest Portable 1 View  08/05/2013   CLINICAL DATA:  Weakness, history MI and placement of coronary artery DIS dense  EXAM: PORTABLE CHEST - 1 VIEW  FINDINGS: The lungs are mildly hypoinflated. The interstitial markings are prominent bilaterally. There is no pleural effusion or pneumothorax.  The cardiopericardial silhouette is mildly enlarged. The central pulmonary vascularity is prominent. The trachea is midline. Calcified lymph nodes are present in the mediastinum. The observed portions of the bony thorax appear normal.  IMPRESSION: The findings suggest low-grade pulmonary interstitial edema. There is no focal pneumonia nor significant pleural effusion. The findings are accentuated by mild bilateral pulmonary hypo inflation. When the patient can tolerate the procedure, a PA and lateral  chest x-ray would be of value.   Electronically Signed   By: David  [place]   On: 08/05/2013 12:46    Scheduled Meds: . aspirin  81 mg Oral Daily  . enoxaparin (LOVENOX) injection  40 mg Subcutaneous Q24H  . insulin aspart  0-9 Units Subcutaneous TID WC  . losartan  50 mg Oral Daily  . metoprolol tartrate  25 mg Oral BID  . omega-3 acid ethyl esters  1 g Oral Daily  . sodium chloride  3 mL Intravenous Q12H   Continuous Infusions: . sodium chloride 75 mL/hr at 08/05/13 2030    Principal Problem:   DKA, type 2, not at goal Active Problems:   HYPERTHYROIDISM   HYPERLIPIDEMIA   DECREASED HEARING   CORONARY ARTERY DISEASE   BENIGN PROSTATIC HYPERTROPHY   Dehydration   Fall at home   Chronic kidney disease (CKD), stage III (moderate)    Time spent: > 35 minutes    Penny Pia  [facility] Pager [phone]. If 7PM-7AM, please contact night-coverage at [email or web address], password [facility] 08/06/2013, 10:43 AM  LOS: 1 day

## 2013-08-06 NOTE — Care Management Note (Unsigned)
    Page 1 of 1   08/06/2013     3:22:52 PM   CARE MANAGEMENT NOTE 08/06/2013  Patient:  Ronnie Case,Ronnie Case   Account Number:  [id number]  Date Initiated:  08/06/2013  Documentation initiated by:  GRAVES-BIGELOW,Pierre Dellarocco  Subjective/Objective Assessment:   Pt admitted for Fall & poorly controlled DM 2. PT recommends SNF placement once medically stable for d/c.     Action/Plan:   CSW to assist with disposition needs.   Anticipated DC Date:  08/09/2013   Anticipated DC Plan:  SKILLED NURSING FACILITY  In-house referral  Clinical Social Worker      DC Planning Services  CM consult      Choice offered to / List presented to:             Status of service:  In process, will continue to follow Medicare Important Message given?   (If response is "NO", the following Medicare IM given date fields will be blank) Date Medicare IM given:   Date Additional Medicare IM given:    Discharge Disposition:    Per UR Regulation:  Reviewed for med. necessity/level of care/duration of stay  If discussed at Long Length of Stay Meetings, dates discussed:    Comments:

## 2013-08-06 NOTE — Progress Notes (Signed)
Inpatient Diabetes Program Recommendations  AACE/ADA: New Consensus Statement on Inpatient Glycemic Control (2013)  Target Ranges:  Prepandial:   less than 140 mg/dL      Peak postprandial:   less than 180 mg/dL (1-2 hours)      Critically ill patients:  140 - 180 mg/dL   Pt takes 13/24 at home, 50 units before breakfast, 30 units before lunch, 20 units before dinner For a total of 100 units per day.  Total NPH calculates to 67 units NPH (basal) and 33 units covering meals divided into 3. Please order basal insulin per below or restart his 70/30 at least twice a day 30 units twice a day.  OR: Inpatient Diabetes Program Recommendations Insulin - Basal: Pt needs basal insulin (Should receive before drip is discontinued).  Please start some basal either (NPH 20-25 units bid am and HS) or Lantus 50 units daily or HS Insulin - Meal Coverage: Please add at least 6 units tidwc  Thank you, Lenor Coffin, RN, CNS, Diabetes Coordinator [phone])

## 2013-08-06 NOTE — Plan of Care (Signed)
Problem: Food- and Nutrition-Related Knowledge Deficit (NB-1.1) Goal: Nutrition education Formal process to instruct or train a patient/client in a skill or to impart knowledge to help patients/clients voluntarily manage or modify food choices and eating behavior to maintain or improve health. Outcome: Completed/Met Date Met:  08/06/13  RD consulted for nutrition education regarding diabetes. Patient is not a new diabetic, he has uncontrolled diabetes. Per discussion with patient's nephew, patient eats what he wants to and does not follow a specific diet due to his own preferences. Patient likely to be discharged to a SNF at D/C. Patient is not appropriate for diet education at this time. He is currently sleeping, woke up, but was slightly disoriented. This is common per nephew.    Lab Results  Component Value Date    HGBA1C 10.1* 08/05/2013    RD provided "Carbohydrate Counting for People with Diabetes" handout from the Academy of Nutrition and Dietetics. Discussed different food groups and their effects on blood sugar, emphasizing carbohydrate-containing foods. Provided list of carbohydrates and recommended serving sizes of common foods.  Expect poor compliance.  Body mass index is 30.62 kg/(m^2). Pt meets criteria for obesity, class 1 based on current BMI.  Current diet order is CHO modified medium, patient is consuming approximately 75-100% of meals at this time. Labs and medications reviewed. No further nutrition interventions warranted at this time. RD contact information provided. If additional nutrition issues arise, please re-consult RD.  Joaquin Courts, RD, LDN, CNSC Pager [phone] After Hours Pager [phone]

## 2013-08-07 DIAGNOSIS — R7309 Other abnormal glucose: Secondary | ICD-10-CM

## 2013-08-07 LAB — GLUCOSE, CAPILLARY
Glucose-Capillary: 351 mg/dL — ABNORMAL HIGH (ref 70–99)
Glucose-Capillary: 326 mg/dL — ABNORMAL HIGH (ref 70–99)

## 2013-08-07 MED ORDER — ALBUTEROL SULFATE (2.5 MG/3ML) 0.083% IN NEBU
2.5000 mg | INHALATION_SOLUTION | RESPIRATORY_TRACT | Status: DC | PRN
  Administered 2013-08-08: 2.5 mg via RESPIRATORY_TRACT
  Filled 2013-08-07 (×2): qty 3

## 2013-08-07 MED ORDER — INSULIN ASPART 100 UNIT/ML ~~LOC~~ SOLN
0.0000 [IU] | Freq: Three times a day (TID) | SUBCUTANEOUS | Status: DC
  Administered 2013-08-07: 15 [IU] via SUBCUTANEOUS
  Administered 2013-08-08: 8 [IU] via SUBCUTANEOUS
  Administered 2013-08-08: 11 [IU] via SUBCUTANEOUS

## 2013-08-07 MED ORDER — INSULIN ASPART 100 UNIT/ML ~~LOC~~ SOLN
0.0000 [IU] | Freq: Every day | SUBCUTANEOUS | Status: DC
  Administered 2013-08-07: 4 [IU] via SUBCUTANEOUS

## 2013-08-07 MED ORDER — INSULIN DETEMIR 100 UNIT/ML ~~LOC~~ SOLN
10.0000 [IU] | Freq: Every day | SUBCUTANEOUS | Status: DC
  Administered 2013-08-07: 10 [IU] via SUBCUTANEOUS
  Filled 2013-08-07 (×3): qty 0.1

## 2013-08-07 MED ORDER — INSULIN DETEMIR 100 UNIT/ML ~~LOC~~ SOLN: 10.0000 [IU] | Freq: Every day | SUBCUTANEOUS | Status: DC

## 2013-08-07 NOTE — Clinical Social Work Placement (Addendum)
Clinical Social Work Department  CLINICAL SOCIAL WORK PLACEMENT NOTE  08/07/2013 Patient: Ronnie Case  Account Number: [id number]  Admit date: 08/05/13 Clinical Social Worker: Sabino Niemann LCSWA Date/time: 08/07/2013 11:30 AM  Clinical Social Work is seeking post-discharge placement for this patient at the following level of care: SKILLED NURSING (*CSW will update this form in Epic as items are completed)  12/30/2014Patient/family provided with [facility] Department of Clinical Social Work's list of facilities offering this level of care within the geographic area requested by the patient (or if unable, by the patient's family).  12/30/2014Patient/family informed of their freedom to choose among providers that offer the needed level of care, that participate in Medicare, Medicaid or managed care program needed by the patient, have an available bed and are willing to accept the patient.  12/30/2014Patient/family informed of [facility]' ownership interest in [facility], as well as of the fact that they are under no obligation to receive care at this facility.  PASARR submitted to EDS on 08/07/13 PASARR number received from EDS on FL2 transmitted to all facilities in geographic area requested by pt/family on 08/07/2013 FL2 transmitted to all facilities within larger geographic area on  Patient informed that his/her managed care company has contracts with or will negotiate with certain facilities, including the following:  Patient/family informed of bed offers received:  Patient chooses bed at [facility] Physician recommends and patient chooses bed at  Patient to be transferred to on .08/07/13 Patient to be transferred to facility by [facility] The following physician request were entered in Epic:  Additional Comments:

## 2013-08-07 NOTE — Progress Notes (Signed)
Inpatient Diabetes Program Recommendations  AACE/ADA: New Consensus Statement on Inpatient Glycemic Control (2013)  Target Ranges:  Prepandial:   less than 140 mg/dL      Peak postprandial:   less than 180 mg/dL (1-2 hours)      Critically ill patients:  140 - 180 mg/dL   Continued hyperglycemia now in 300 range.  Pt's home insulin is 70/30 insulin 3 times per day: 50 u am,30 units ac lunch, and 20 units ac dinner/supper. Total Basal insulin (NPH) per day calculates to 67 units (NPH): 33 units with breakfast, 20 units with lunch, and 13 units with supper.  Meal coverge (R) per day totals 33 units: 17 units with breakfast,  10 units with lunch, and 7 units with supper.  Total daily insulin needs calculate to 100 units per day.  Pt here on minimal doses of insulin with 10 units basal levemir, and moderate correction which does not appear to be correcting nor covering meals. Considering diagnosis of rhabdomyoloysis, would consider need for glucose and protein absorption very much dependent on adequate insulin doses.   Inpatient Diabetes Program Recommendations Insulin - Basal: Pt needs basal insulin (Should receive before drip is discontinued).  Please start some basal either (NPH 20-25 units bid am and HS) or Lantus 50 units daily or HS Insulin - Meal Coverage: Please add at least 6 units tidwc  Thank you, Lenor Coffin, RN, CNS, Diabetes Coordinator [phone])

## 2013-08-07 NOTE — Progress Notes (Signed)
[facility] PROGRESS NOTE  Ronnie Case YNW:[id number] DOB: 01-10-1941 DOA: 08/05/2013 PCP: Tillman Abide, MD Brief narrative: 72 year old with history of DM on high doses of 70/30 insulin, presented with fall, mild rhabdomyolysis in mild DKA. Required insulin drip initially. Physical therapy recommending skilled nursing facility placement  Assessment/Plan: Principal Problem:   DKA, type 2, not at goal - resolved and patient currently off of insulin gtt - most likely contributed to overall weakness with prolonged down time leading to rhabdomyolysis  - Carb modified diet -Blood sugar levels not well controlled on sliding scale insulin as such will add basal insulin 10 units of Levemir daily. We'll reassess blood sugars on this regimen.  Active Problems:   HYPERTHYROIDISM - TSH on 04/30/13 was WNL - will reassess TSH     HYPERLIPIDEMIA - currently on lovaza    CORONARY ARTERY DISEASE - stable, no chest pain complaints. Continue asa and lovaza    BENIGN PROSTATIC HYPERTROPHY   Dehydration - Most likely due to principal problem. Improved initially with IV fluids and improved oral intake.    Fall at home - Suspect secondary to combination of dehydration and DKA. Physical therapy recommended skilled nursing facility on discharge.    Chronic kidney disease (CKD), stage III (moderate) - Stable. Serum creatinine trending down with IV fluid administration.  Rhabdomyolysis - Initial CK 572. Followup CK pending we'll continue to monitor CK levels but suspect continued decline in CK levels with IV fluid administration as well as improved oral intake. - Most likely cause of red tinged urine for myoglobin breakdown products.  Code Status: full Family Communication: no family at bedside. Disposition Plan: To SNF once blood sugars improved. Social worker consulted   Consultants:  Physical therapist  Social worker    Procedures:  None  Antibiotics:  None  HPI/Subjective: Patient currently feeling better. No new complaints  Objective: Filed Vitals:   08/07/13 1341  BP: 131/59  Pulse: 74  Temp: 98.2 F (36.8 C)  Resp: 18    Intake/Output Summary (Last 24 hours) at 08/07/13 1628 Last data filed at 08/07/13 1342  Gross per 24 hour  Intake 1346.75 ml  Output    976 ml  Net 370.75 ml   Filed Weights   08/05/13 1111 08/06/13 2049 08/07/13 0638  Weight: 111.131 kg (245 lb) 105.915 kg (233 lb 8 oz) 105.597 kg (232 lb 12.8 oz)    Exam:   General:  Patient appearing disheveled, alert and awake  Cardiovascular: Regular rate and rhythm, no murmurs  Respiratory: No increased work of breathing, no audible wheezes, no rales  Abdomen: Soft, nontender, nondistended  Musculoskeletal: No cyanosis, no clubbing  Data Reviewed: Basic Metabolic Panel:  Recent Labs Lab 08/05/13 1147 08/05/13 1503 08/06/13 1026  NA 134* 135 136  K 4.9 4.7 4.1  CL 92* 96 97  CO2 24 25 23   GLUCOSE 429* 399* 329*  BUN 24* 25* 25*  CREATININE 1.41* 1.33 1.24  CALCIUM 9.1 8.7 8.5   Liver Function Tests: No results found for this basename: AST, ALT, ALKPHOS, BILITOT, PROT, ALBUMIN,  in the last 168 hours No results found for this basename: LIPASE, AMYLASE,  in the last 168 hours No results found for this basename: AMMONIA,  in the last 168 hours CBC:  Recent Labs Lab 08/05/13 1147  WBC 11.3*  NEUTROABS 9.9*  HGB 16.0  HCT 46.1  MCV 90.2  PLT 159   Cardiac Enzymes:  Recent Labs Lab 08/05/13 1147 08/06/13 1026 08/06/13 1200  CKTOTAL  572* 512* 487*   BNP (last 3 results) No results found for this basename: PROBNP,  in the last 8760 hours CBG:  Recent Labs Lab 08/06/13 1126 08/06/13 1620 08/06/13 2054 08/07/13 0734 08/07/13 1140  GLUCAP 311* 291* 346* 273* 351*    No results found for this or any previous visit (from the past 240 hour(s)).   Studies: No results  found.  Scheduled Meds: . aspirin  81 mg Oral Daily  . enoxaparin (LOVENOX) injection  40 mg Subcutaneous Q24H  . insulin aspart  0-9 Units Subcutaneous TID WC  . losartan  50 mg Oral Daily  . metoprolol tartrate  25 mg Oral BID  . omega-3 acid ethyl esters  1 g Oral Daily  . sodium chloride  3 mL Intravenous Q12H   Continuous Infusions: . sodium chloride 75 mL/hr at 08/07/13 1223    Principal Problem:   DKA, type 2, not at goal Active Problems:   HYPERTHYROIDISM   HYPERLIPIDEMIA   DECREASED HEARING   CORONARY ARTERY DISEASE   BENIGN PROSTATIC HYPERTROPHY   Dehydration   Fall at home   Chronic kidney disease (CKD), stage III (moderate)    Time spent: > 35 minutes    Penny Pia  [facility] Pager [phone]. If 7PM-7AM, please contact night-coverage at [email or web address], password [facility] 08/07/2013, 4:28 PM  LOS: 2 days

## 2013-08-08 LAB — GLUCOSE, CAPILLARY: Glucose-Capillary: 310 mg/dL — ABNORMAL HIGH (ref 70–99)

## 2013-08-08 MED ORDER — ZOLPIDEM TARTRATE 5 MG PO TABS
5.0000 mg | ORAL_TABLET | Freq: Once | ORAL | Status: AC
  Administered 2013-08-08: 5 mg via ORAL
  Filled 2013-08-08: qty 1

## 2013-08-08 MED ORDER — METOPROLOL TARTRATE 25 MG PO TABS: 12.5000 mg | ORAL_TABLET | Freq: Two times a day (BID) | ORAL | Status: DC

## 2013-08-08 MED ORDER — LOSARTAN POTASSIUM 25 MG PO TABS: 25.0000 mg | ORAL_TABLET | Freq: Every day | ORAL | Status: DC

## 2013-08-08 MED ORDER — INSULIN ASPART 100 UNIT/ML ~~LOC~~ SOLN: 0.0000 [IU] | Freq: Three times a day (TID) | SUBCUTANEOUS | Status: DC

## 2013-08-08 MED ORDER — INSULIN GLARGINE 100 UNIT/ML ~~LOC~~ SOLN
40.0000 [IU] | Freq: Every day | SUBCUTANEOUS | Status: DC
  Administered 2013-08-08: 40 [IU] via SUBCUTANEOUS
  Filled 2013-08-08: qty 0.4

## 2013-08-08 MED ORDER — INSULIN DETEMIR 100 UNIT/ML ~~LOC~~ SOLN
10.0000 [IU] | Freq: Every day | SUBCUTANEOUS | Status: DC
  Filled 2013-08-08: qty 0.1

## 2013-08-08 MED ORDER — INSULIN GLARGINE 100 UNIT/ML ~~LOC~~ SOLN: 40.0000 [IU] | Freq: Every day | SUBCUTANEOUS | Status: DC

## 2013-08-08 NOTE — Progress Notes (Signed)
Addended with updated frequency to reflect SNF for d/c (changed from 3 xs/wk to 2 xs/wk per departmental standards).   Rollene Rotunda Mikeala Girdler, PT, DPT [phone]

## 2013-08-08 NOTE — Progress Notes (Signed)
Read, reviewed, and agree.  Cameren Odwyer B. Kuper Rennels, PT, DPT #[id number]  

## 2013-08-08 NOTE — Progress Notes (Addendum)
Physical Therapy Treatment Patient Details Name: Okechukwu Regnier MRN: [id number] DOB: 12-24-1940 Today's Date: 08/08/2013 Time: 0934-1000 PT Time Calculation (min): 26 min  PT Assessment / Plan / Recommendation  History of Present Illness 72 y.o. male with history of poorly controlled DM 2, nephropathy, CAD status post stents, hypothyroidism, hard of hearing, presented to the ED following the fall at home. Patient states that he was in his usual state of health until 10 PM on 08/04/13 when he tried to get up from his chair in the living room, felt dizzy and fell on the carpeted floor. He denies vertigo, headache, chest pain, palpitations or dyspnea. He denies loss of consciousness or any injuries. Due to weakness, he was unable to get up and stayed on the carpeted floor for approximately 12 hours. He was then able to crawl to his phone and call 911. He does give history of dizziness at times in the past. He denies slurred speech, facial twisting or asymmetrical weakness. EMS found him on the floor. He also vomited x3 which was nonbloody. In the ED, initial blood glucose was 429, creatinine 1.41 and initial anion gap was 18 even though bicarbonate was 24, WBC 11.3 and CK of 572. CT head and neck without acute findings. Chest x-ray suggests low-grade pulmonary interstitial edema. Hospitalist admission requested. Nephew at bedside and indicates that patient is noncompliant with his diet.   PT Comments   Pt admitted with above. Pt currently with functional limitations due to decreased balanced, mobility, and motor planning.  Pt will benefit from skilled PT to increase their independence and safety with mobility to allow discharge to the venue listed below.  Patient is very unstable with ambulation, and is unsafe to do so without staff.  He requires support as he frequently stumbles and requires A to avoid fall.   Follow Up Recommendations  SNF           Equipment Recommendations  None recommended by  PT       Frequency Min 2X/week   Progress towards PT Goals Progress towards PT goals: Progressing toward goals  Plan Current plan remains appropriate    Precautions / Restrictions Precautions Precautions: Fall Restrictions Weight Bearing Restrictions: No   Pertinent Vitals/Pain Patient performed session without O2, Mountain returned at end of session.  98% at beginning of session without o2, 96% at rest break halfway through walking.    Mobility  Bed Mobility Bed Mobility: Sit to Supine Sit to Supine: 4: Min assist Details for Bed Mobility Assistance: Significant VC for log roll technique to get sitting upright Transfers Transfers: Sit to Stand;Stand to Sit Sit to Stand: 4: Min assist Stand to Sit: 4: Min assist Ambulation/Gait Ambulation/Gait Assistance: 3: Mod assist Ambulation Distance (Feet): 150 Feet Assistive device: None (Requires railing to hold onto for support at all times) Ambulation/Gait Assistance Details: Patient has very unstable gait pattern throughout the session.  Patient frequently stumbles back and forth from the railing into the therapist walking with him.  Patient reports this is not his baseline, but has been unsteady for the last couple of months. Rest break x1 initiated by PT to check vitals. Gait Pattern: Decreased stride length, very slow and deliberate Stairs: No      PT Goals (current goals can now be found in the care plan section) Acute Rehab PT Goals PT Goal Formulation: With patient Time For Goal Achievement: 08/20/13 Potential to Achieve Goals: Good  Visit Information  Last PT Received On: 08/08/13 Assistance Needed: +1  History of Present Illness: 72 y.o. male with history of poorly controlled DM 2, nephropathy, CAD status post stents, hypothyroidism, hard of hearing, presented to the ED following the fall at home. Patient states that he was in his usual state of health until 10 PM on 08/04/13 when he tried to get up from his chair in the living  room, felt dizzy and fell on the carpeted floor. He denies vertigo, headache, chest pain, palpitations or dyspnea. He denies loss of consciousness or any injuries. Due to weakness, he was unable to get up and stayed on the carpeted floor for approximately 12 hours. He was then able to crawl to his phone and call 911. He does give history of dizziness at times in the past. He denies slurred speech, facial twisting or asymmetrical weakness. EMS found him on the floor. He also vomited x3 which was nonbloody. In the ED, initial blood glucose was 429, creatinine 1.41 and initial anion gap was 18 even though bicarbonate was 24, WBC 11.3 and CK of 572. CT head and neck without acute findings. Chest x-ray suggests low-grade pulmonary interstitial edema. Hospitalist admission requested. Nephew at bedside and indicates that patient is noncompliant with his diet.       Cognition  Cognition Arousal/Alertness: Awake/alert Behavior During Therapy: WFL for tasks assessed/performed Overall Cognitive Status: Within Functional Limits for tasks assessed           GP    Barrie Dunker, SPT Pager:  [phone]  Barrie Dunker 08/08/2013, 11:05 AM

## 2013-08-08 NOTE — Discharge Summary (Signed)
Physician Discharge Summary  Ronnie Case ZOX:[id number] DOB: 1941-03-15 DOA: 08/05/2013  PCP: Tillman Abide, MD  Admit date: 08/05/2013 Discharge date: 08/08/2013  Time spent: *50 minutes  Recommendations for Outpatient Follow-up:  1. *Follow up PCP in 2 weeks  Discharge Diagnoses:  Principal Problem:   DKA, type 2, not at goal Active Problems:   HYPERTHYROIDISM   HYPERLIPIDEMIA   DECREASED HEARING   CORONARY ARTERY DISEASE   BENIGN PROSTATIC HYPERTROPHY   Dehydration   Fall at home   Chronic kidney disease (CKD), stage III (moderate)   Rhabdomyolysis   Discharge Condition: Stable  Diet recommendation: Diabetic diet  Filed Weights   08/06/13 2049 08/07/13 0638 08/08/13 0444  Weight: 105.915 kg (233 lb 8 oz) 105.597 kg (232 lb 12.8 oz) 107.684 kg (237 lb 6.4 oz)    History of present illness:  72 y.o. male with history of poorly controlled DM 2, nephropathy, CAD status post stents, hypothyroidism, hard of hearing, presented to the ED following the fall at home. Patient states that he was in his usual state of health until 10 PM on 08/04/13 when he tried to get up from his chair in the living room, felt dizzy and fell on the carpeted floor. He denies vertigo, headache, chest pain, palpitations or dyspnea. He denies loss of consciousness or any injuries. Due to weakness, he was unable to get up and stayed on the carpeted floor for approximately 12 hours. He was then able to crawl to his phone and call 911. He does give history of dizziness at times in the past. He denies slurred speech, facial twisting or asymmetrical weakness. EMS found him on the floor. He also vomited x3 which was nonbloody. In the ED, initial blood glucose was 429, creatinine 1.41 and initial anion gap was 18 even though bicarbonate was 24, WBC 11.3 and CK of 572. CT head and neck without acute findings. Chest x-ray suggests low-grade pulmonary interstitial edema. Hospitalist admission requested. Nephew at  bedside and indicates that patient is noncompliant with his diet.   Hospital Course:  DKA, type 2, not at goal  Patient was admitted with mild DKA and started on insulin GTT. Once sugar was controlled he was changed to long-acting insulin levemir 10 units, his blood sugar continues to be limited so at this time we have discontinued levemir and started him on Lantus 40 units daily. Patient also will be discharged on sliding-scale insulin at the nursing facility. He'll continue take his metformin as well as Glucotrol. We'll continue carb modified diet    HYPERTHYROIDISM  - TSH on 04/30/13 was WNL  - TSH was 2.217  HYPERLIPIDEMIA  - currently on lovaza   CORONARY ARTERY DISEASE  - stable, no chest pain complaints. Continue asa and lovaza    Dehydration / dizziness Resolved Discussed with patient's nephew, who says that patient has been feeling very dizzy over the past few days before this event. Patient was prescribed Maxzide for hearing loss, and not for hypertension. Maxzide has been discontinued at this time, will not restart due to risk for orthostatic hypotension and fall. Patient's pressure continues to be around systolic 120s, I will also cut down the metoprolol to 12.5 mg by mouth twice a day and Cozaar 25 mg by mouth daily. Patient's blood pressure need to be monitored at the nursing facility and medications can be adjusted as needed. Avoid diuretics   Chronic kidney disease (CKD), stage III (moderate)  - Stable. Serum creatinine trending down with IV fluid administration.  Creatinine now at 1.24 Diuretics are discontinued  Rhabdomyolysis  Patient came with mild rhabdomyolysis with CPK of 572 and it has come down to 487, patient is not having any muscle aches he was given IV fluids in the hospital. .  Procedures:  None  Consultations:  None  Discharge Exam: Filed Vitals:   08/08/13 1330  BP: 123/60  Pulse: 65  Temp: 98 F (36.7 C)  Resp: 16    General: Appear in  no acute distress  Cardiovascular: S1-S2 regular  Respiratory: Clear bilaterally  Discharge Instructions  Discharge Orders   Future Appointments Provider Department Dept Phone   11/05/2013 11:30 AM Karie Schwalbe, MD [facility] [phone]   Future Orders Complete By Expires   Diet - low sodium heart healthy  As directed    Discharge instructions  As directed    Comments:     Check your Blood pressure , if  Less than 100, call your Primary care Doctor   Increase activity slowly  As directed        Medication List    STOP taking these medications       HUMULIN 70/30 (70-30) 100 UNIT/ML injection  Generic drug:  insulin NPH-regular     triamterene-hydrochlorothiazide 37.5-25 MG per tablet  Commonly known as:  MAXZIDE-25      TAKE these medications       ACCU-CHEK COMPACT TEST DRUM test strip  Generic drug:  glucose blood  TEST BLOOD SUGAR THREE TIMES DAILY     aspirin 81 MG tablet  Take 81 mg by mouth daily.     fish oil-omega-3 fatty acids 1000 MG capsule  Take 1 g by mouth daily.     glipiZIDE 5 MG tablet  Commonly known as:  GLUCOTROL  Take 1 tablet (5 mg total) by mouth daily.     insulin aspart 100 UNIT/ML injection  Commonly known as:  novoLOG  Inject 0-15 Units into the skin 3 (three) times daily with meals.     insulin glargine 100 UNIT/ML injection  Commonly known as:  LANTUS  Inject 0.4 mLs (40 Units total) into the skin at bedtime.  Start taking on:  08/09/2013     losartan 25 MG tablet  Commonly known as:  COZAAR  Take 1 tablet (25 mg total) by mouth daily.     metFORMIN 1000 MG tablet  Commonly known as:  GLUCOPHAGE  TAKE 2 TABLETS BY MOUTH EVERY DAY     metoprolol tartrate 25 MG tablet  Commonly known as:  LOPRESSOR  Take 0.5 tablets (12.5 mg total) by mouth 2 (two) times daily.     nitroGLYCERIN 0.4 MG SL tablet  Commonly known as:  NITROSTAT  Place 1 tablet (0.4 mg total) under the tongue every 5 (five) minutes  as needed.     ONE-A-DAY 50 PLUS PO  Take by mouth daily.     simvastatin 40 MG tablet  Commonly known as:  ZOCOR  TAKE 1 TABLET BY MOUTH EVERY NIGHT AT BEDTIME       Allergies  Allergen Reactions  . Ofloxacin     REACTION: prostate swelling mouth swelling      The results of significant diagnostics from this hospitalization (including imaging, microbiology, ancillary and laboratory) are listed below for reference.    Significant Diagnostic Studies: Ct Head Wo Contrast  08/05/2013   CLINICAL DATA:  Dizziness then fell last night, generalized weakness, history diabetes, coronary artery disease post MI  EXAM:  CT HEAD WITHOUT CONTRAST  CT CERVICAL SPINE WITHOUT CONTRAST  TECHNIQUE: Multidetector CT imaging of the head and cervical spine was performed following the standard protocol without intravenous contrast. Multiplanar CT image reconstructions of the cervical spine were also generated.  COMPARISON:  None  FINDINGS: CT HEAD FINDINGS  Mild atrophy.  Normal ventricular morphology.  No midline shift or mass effect.  Minimal small vessel chronic ischemic changes of deep cerebral white matter.  No intracranial hemorrhage, mass lesion, or evidence of acute infarction.  No extra-axial fluid collections.  No acute osseous findings. Mucosal thickening in bilateral ethmoid air cells with air-fluid level and central high attenuation within left maxillary sinus; cannot exclude hemorrhage though no definite facial bone fracture is evident.  CT CERVICAL SPINE FINDINGS  Visualized skullbase intact.  Scattered degenerative facet disease changes of the cervical spine.  Scattered disc space narrowing and minimal endplate spur formation throughout cervical spine.  Prevertebral soft tissues normal thickness.  Vertebral body heights maintained without fracture or subluxation.  Asymmetric thyroid lobes, larger on left, though no discrete mass is visualized.  No acute osseous abnormalities identified.  IMPRESSION:  No acute intracranial abnormalities.  Question left maxillary sinus disease with central high attenuation within the opacified left maxillary sinus which could represent developing calcification or blood.  Degenerative disc and facet disease changes of the cervical spine.  No acute cervical spine abnormalities.   Electronically Signed   By: Ulyses Southward M.D.   On: 08/05/2013 12:30   Ct Cervical Spine Wo Contrast  08/05/2013   CLINICAL DATA:  Dizziness then fell last night, generalized weakness, history diabetes, coronary artery disease post MI  EXAM: CT HEAD WITHOUT CONTRAST  CT CERVICAL SPINE WITHOUT CONTRAST  TECHNIQUE: Multidetector CT imaging of the head and cervical spine was performed following the standard protocol without intravenous contrast. Multiplanar CT image reconstructions of the cervical spine were also generated.  COMPARISON:  None  FINDINGS: CT HEAD FINDINGS  Mild atrophy.  Normal ventricular morphology.  No midline shift or mass effect.  Minimal small vessel chronic ischemic changes of deep cerebral white matter.  No intracranial hemorrhage, mass lesion, or evidence of acute infarction.  No extra-axial fluid collections.  No acute osseous findings. Mucosal thickening in bilateral ethmoid air cells with air-fluid level and central high attenuation within left maxillary sinus; cannot exclude hemorrhage though no definite facial bone fracture is evident.  CT CERVICAL SPINE FINDINGS  Visualized skullbase intact.  Scattered degenerative facet disease changes of the cervical spine.  Scattered disc space narrowing and minimal endplate spur formation throughout cervical spine.  Prevertebral soft tissues normal thickness.  Vertebral body heights maintained without fracture or subluxation.  Asymmetric thyroid lobes, larger on left, though no discrete mass is visualized.  No acute osseous abnormalities identified.  IMPRESSION: No acute intracranial abnormalities.  Question left maxillary sinus disease  with central high attenuation within the opacified left maxillary sinus which could represent developing calcification or blood.  Degenerative disc and facet disease changes of the cervical spine.  No acute cervical spine abnormalities.   Electronically Signed   By: Ulyses Southward M.D.   On: 08/05/2013 12:30   Dg Chest Portable 1 View  08/05/2013   CLINICAL DATA:  Weakness, history MI and placement of coronary artery DIS dense  EXAM: PORTABLE CHEST - 1 VIEW  FINDINGS: The lungs are mildly hypoinflated. The interstitial markings are prominent bilaterally. There is no pleural effusion or pneumothorax. The cardiopericardial silhouette is mildly enlarged. The central pulmonary  vascularity is prominent. The trachea is midline. Calcified lymph nodes are present in the mediastinum. The observed portions of the bony thorax appear normal.  IMPRESSION: The findings suggest low-grade pulmonary interstitial edema. There is no focal pneumonia nor significant pleural effusion. The findings are accentuated by mild bilateral pulmonary hypo inflation. When the patient can tolerate the procedure, a PA and lateral chest x-ray would be of value.   Electronically Signed   By: David  [place]   On: 08/05/2013 12:46    Microbiology: No results found for this or any previous visit (from the past 240 hour(s)).   Labs: Basic Metabolic Panel:  Recent Labs Lab 08/05/13 1147 08/05/13 1503 08/06/13 1026  NA 134* 135 136  K 4.9 4.7 4.1  CL 92* 96 97  CO2 24 25 23   GLUCOSE 429* 399* 329*  BUN 24* 25* 25*  CREATININE 1.41* 1.33 1.24  CALCIUM 9.1 8.7 8.5   Liver Function Tests: No results found for this basename: AST, ALT, ALKPHOS, BILITOT, PROT, ALBUMIN,  in the last 168 hours No results found for this basename: LIPASE, AMYLASE,  in the last 168 hours No results found for this basename: AMMONIA,  in the last 168 hours CBC:  Recent Labs Lab 08/05/13 1147  WBC 11.3*  NEUTROABS 9.9*  HGB 16.0  HCT 46.1  MCV 90.2   PLT 159   Cardiac Enzymes:  Recent Labs Lab 08/05/13 1147 08/06/13 1026 08/06/13 1200  CKTOTAL 572* 512* 487*   BNP: BNP (last 3 results) No results found for this basename: PROBNP,  in the last 8760 hours CBG:  Recent Labs Lab 08/07/13 1140 08/07/13 1629 08/07/13 2226 08/08/13 0743 08/08/13 1239  GLUCAP 351* 352* 326* 310* 283*       Signed:  LAMA,GAGAN S  [facility] 08/08/2013, 2:33 PM

## 2013-08-13 ENCOUNTER — Non-Acute Institutional Stay (SKILLED_NURSING_FACILITY): Payer: Medicare Other | Admitting: Internal Medicine

## 2013-08-13 ENCOUNTER — Encounter: Payer: Self-pay | Admitting: Internal Medicine

## 2013-08-13 DIAGNOSIS — IMO0001 Reserved for inherently not codable concepts without codable children: Secondary | ICD-10-CM

## 2013-08-13 DIAGNOSIS — R531 Weakness: Secondary | ICD-10-CM

## 2013-08-13 DIAGNOSIS — E785 Hyperlipidemia, unspecified: Secondary | ICD-10-CM

## 2013-08-13 DIAGNOSIS — E1165 Type 2 diabetes mellitus with hyperglycemia: Secondary | ICD-10-CM

## 2013-08-13 DIAGNOSIS — D72829 Elevated white blood cell count, unspecified: Secondary | ICD-10-CM | POA: Insufficient documentation

## 2013-08-13 DIAGNOSIS — R5381 Other malaise: Secondary | ICD-10-CM

## 2013-08-13 DIAGNOSIS — R5383 Other fatigue: Secondary | ICD-10-CM

## 2013-08-13 DIAGNOSIS — I2510 Atherosclerotic heart disease of native coronary artery without angina pectoris: Secondary | ICD-10-CM

## 2013-08-13 DIAGNOSIS — B353 Tinea pedis: Secondary | ICD-10-CM | POA: Insufficient documentation

## 2013-08-13 NOTE — Progress Notes (Signed)
Patient ID: Ronnie Case, male   DOB: 06-26-1941, 73 y.o.   MRN: [id number]     [facility]    PCP: Tillman Abide, MD  Code Status: full code  Allergies  Allergen Reactions  . Ofloxacin     REACTION: prostate swelling mouth swelling    Chief Complaint: new admit  HPI:  73 y/o male patient is here for STR after hospital admission from 08/05/13- 08/08/13 with diabetic ketoacidosis. He has history of poorly controlled dm type 2, CAD, hypothyroidism. He had weakness and was found to be on floor for 12 hours. 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 Ct head showed no acute findings and cxr was s/o mild pulmonary interstitial edema. He was started on insulin drip and his anion gap closed. He did not tolerate levemir as desired and was switched to lantus.He is tolerating the lantus dose well. His bp meds were adjusted with his orthostatics in the hospital. He was sent here for rehabilitation. He is working well with therapy team. He denies any complaints this visit. No further dizziness.  Review of Systems:  Constitutional: Negative for fever, chills, weight loss, malaise/fatigue and diaphoresis.  HENT: Negative for congestion, hearing loss and sore throat.   Eyes: Negative for blurred vision, double vision and discharge.  Respiratory: Negative for cough, sputum production, shortness of breath and wheezing.   Cardiovascular: Negative for chest pain, palpitations, orthopnea and leg swelling.  Gastrointestinal: Negative for heartburn, nausea, vomiting, abdominal pain, diarrhea and constipation.  Genitourinary: Negative for dysuria, urgency, frequency and flank pain.  Musculoskeletal: Negative for back pain, falls, joint pain and myalgias.  Skin: Negative for itching and rash.  Neurological: Positive for weakness. Negative for dizziness, tingling, focal weakness and headaches.  Psychiatric/Behavioral: Negative for depression and memory loss. The patient is not nervous/anxious.      Past Medical History  Diagnosis Date  . CAD (coronary artery disease)     s/p MI 1991, stents 1996, 2003, 2005.   . Diabetes mellitus   . Hypothyroidism   . Osteoarthritis   . BPH (benign prostatic hypertrophy)   . MI (myocardial infarction)    Past Surgical History  Procedure Laterality Date  . Thumb amputation      1970 Work accident--left thumb amputated  . Coronary angioplasty with stent placement      Cardiac stents 1996, 2003, 2005  . Elbow surgery      left ( tennis)  . Mastoidectomy      Radical mastoidectomy on right 1982 (infection)  . Repair knee ligament      Left knee repair after fall 2009  . Cataract extraction      B cataract surgery 2010   Social History:   reports that he has quit smoking. He has never used smokeless tobacco. He reports that he does not drink alcohol or use illicit drugs.  Family History  Problem Relation Age of Onset  . Cancer Neg Hx     no prostate or colon cancer    Medications: Patient's Medications  New Prescriptions   No medications on file  Previous Medications   ACCU-CHEK COMPACT TEST DRUM TEST STRIP    TEST BLOOD SUGAR THREE TIMES DAILY   ASPIRIN 81 MG TABLET    Take 81 mg by mouth daily.     FISH OIL-OMEGA-3 FATTY ACIDS 1000 MG CAPSULE    Take 1 g by mouth daily.     GLIPIZIDE (GLUCOTROL) 5 MG TABLET    Take 1 tablet (5  mg total) by mouth daily.   INSULIN ASPART (NOVOLOG) 100 UNIT/ML INJECTION    Inject 0-15 Units into the skin 3 (three) times daily with meals.   INSULIN GLARGINE (LANTUS) 100 UNIT/ML INJECTION    Inject 0.4 mLs (40 Units total) into the skin at bedtime.   LOSARTAN (COZAAR) 25 MG TABLET    Take 1 tablet (25 mg total) by mouth daily.   METFORMIN (GLUCOPHAGE) 1000 MG TABLET    TAKE 2 TABLETS BY MOUTH EVERY DAY   METOPROLOL TARTRATE (LOPRESSOR) 25 MG TABLET    Take 0.5 tablets (12.5 mg total) by mouth 2 (two) times daily.   MULTIPLE VITAMINS-MINERALS (ONE-A-DAY 50 PLUS PO)    Take by mouth daily.      NITROGLYCERIN (NITROSTAT) 0.4 MG SL TABLET    Place 1 tablet (0.4 mg total) under the tongue every 5 (five) minutes as needed.   SIMVASTATIN (ZOCOR) 40 MG TABLET    TAKE 1 TABLET BY MOUTH EVERY NIGHT AT BEDTIME  Modified Medications   No medications on file  Discontinued Medications   No medications on file     Physical Exam: Filed Vitals:   08/13/13 1413  BP: 124/66  Pulse: 75  Temp: 97.5 F (36.4 C)  Resp: 18  SpO2: 96%   General- elderly male in no acute distress Head- atraumatic, normocephalic Eyes- PERRLA, EOMI, no pallor, no icterus, no discharge Neck- no lymphadenopathy, no thyromegaly, no jugular vein distension, no carotid bruit Cardiovascular- normal s1,s2, no murmurs/ rubs/ gallops Respiratory- bilateral clear to auscultation, no wheeze, no rhonchi, no crackles Abdomen- bowel sounds present, soft, non tender, no CVA tenderness Musculoskeletal- able to move all 4 extremities, no spinal and paraspinal tenderness, using a walker Neurological- no focal deficit Psychiatry- alert and oriented to person, place and time, normal mood and affect   Labs reviewed: Basic Metabolic Panel:  Recent Labs  16/10/96 1147 08/05/13 1503 08/06/13 1026  NA 134* 135 136  K 4.9 4.7 4.1  CL 92* 96 97  CO2 24 25 23   GLUCOSE 429* 399* 329*  BUN 24* 25* 25*  CREATININE 1.41* 1.33 1.24  CALCIUM 9.1 8.7 8.5   Liver Function Tests:  Recent Labs  04/30/13 1121  AST 25  ALT 29  ALKPHOS 62  BILITOT 0.6  PROT 7.5  ALBUMIN 4.3   No results found for this basename: LIPASE, AMYLASE,  in the last 8760 hours No results found for this basename: AMMONIA,  in the last 8760 hours CBC:  Recent Labs  04/30/13 1121 08/05/13 1147  WBC 8.1 11.3*  NEUTROABS 5.6 9.9*  HGB 15.8 16.0  HCT 46.6 46.1  MCV 90.5 90.2  PLT 176.0 159   Cardiac Enzymes:  Recent Labs  08/05/13 1147 08/06/13 1026 08/06/13 1200  CKTOTAL 572* 512* 487*   BNP: No components found with this basename:  POCBNP,  CBG:  Recent Labs  08/07/13 2226 08/08/13 0743 08/08/13 1239  GLUCAP 326* 310* 283*   Lab Results  Component Value Date   HGBA1C 10.1* 08/05/2013    Radiological Exams: Ct Head Wo Contrast  08/05/2013   CLINICAL DATA:  Dizziness then fell last night, generalized weakness, history diabetes, coronary artery disease post MI  EXAM: CT HEAD WITHOUT CONTRAST  CT CERVICAL SPINE WITHOUT CONTRAST  TECHNIQUE: Multidetector CT imaging of the head and cervical spine was performed following the standard protocol without intravenous contrast. Multiplanar CT image reconstructions of the cervical spine were also generated.  COMPARISON:  None  FINDINGS:  CT HEAD FINDINGS  Mild atrophy.  Normal ventricular morphology.  No midline shift or mass effect.  Minimal small vessel chronic ischemic changes of deep cerebral white matter.  No intracranial hemorrhage, mass lesion, or evidence of acute infarction.  No extra-axial fluid collections.  No acute osseous findings. Mucosal thickening in bilateral ethmoid air cells with air-fluid level and central high attenuation within left maxillary sinus; cannot exclude hemorrhage though no definite facial bone fracture is evident.  CT CERVICAL SPINE FINDINGS  Visualized skullbase intact.  Scattered degenerative facet disease changes of the cervical spine.  Scattered disc space narrowing and minimal endplate spur formation throughout cervical spine.  Prevertebral soft tissues normal thickness.  Vertebral body heights maintained without fracture or subluxation.  Asymmetric thyroid lobes, larger on left, though no discrete mass is visualized.  No acute osseous abnormalities identified.  IMPRESSION: No acute intracranial abnormalities.  Question left maxillary sinus disease with central high attenuation within the opacified left maxillary sinus which could represent developing calcification or blood.  Degenerative disc and facet disease changes of the cervical spine.  No acute  cervical spine abnormalities.   Electronically Signed   By: Ulyses Southward M.D.   On: 08/05/2013 12:30   Ct Cervical Spine Wo Contrast  08/05/2013   CLINICAL DATA:  Dizziness then fell last night, generalized weakness, history diabetes, coronary artery disease post MI  EXAM: CT HEAD WITHOUT CONTRAST  CT CERVICAL SPINE WITHOUT CONTRAST  TECHNIQUE: Multidetector CT imaging of the head and cervical spine was performed following the standard protocol without intravenous contrast. Multiplanar CT image reconstructions of the cervical spine were also generated.  COMPARISON:  None  FINDINGS: CT HEAD FINDINGS  Mild atrophy.  Normal ventricular morphology.  No midline shift or mass effect.  Minimal small vessel chronic ischemic changes of deep cerebral white matter.  No intracranial hemorrhage, mass lesion, or evidence of acute infarction.  No extra-axial fluid collections.  No acute osseous findings. Mucosal thickening in bilateral ethmoid air cells with air-fluid level and central high attenuation within left maxillary sinus; cannot exclude hemorrhage though no definite facial bone fracture is evident.  CT CERVICAL SPINE FINDINGS  Visualized skullbase intact.  Scattered degenerative facet disease changes of the cervical spine.  Scattered disc space narrowing and minimal endplate spur formation throughout cervical spine.  Prevertebral soft tissues normal thickness.  Vertebral body heights maintained without fracture or subluxation.  Asymmetric thyroid lobes, larger on left, though no discrete mass is visualized.  No acute osseous abnormalities identified.  IMPRESSION: No acute intracranial abnormalities.  Question left maxillary sinus disease with central high attenuation within the opacified left maxillary sinus which could represent developing calcification or blood.  Degenerative disc and facet disease changes of the cervical spine.  No acute cervical spine abnormalities.   Electronically Signed   By: Ulyses Southward M.D.    On: 08/05/2013 12:30   Dg Chest Portable 1 View  08/05/2013   CLINICAL DATA:  Weakness, history MI and placement of coronary artery DIS dense  EXAM: PORTABLE CHEST - 1 VIEW  FINDINGS: The lungs are mildly hypoinflated. The interstitial markings are prominent bilaterally. There is no pleural effusion or pneumothorax. The cardiopericardial silhouette is mildly enlarged. The central pulmonary vascularity is prominent. The trachea is midline. Calcified lymph nodes are present in the mediastinum. The observed portions of the bony thorax appear normal.  IMPRESSION: The findings suggest low-grade pulmonary interstitial edema. There is no focal pneumonia nor significant pleural effusion. The findings are accentuated by  mild bilateral pulmonary hypo inflation. When the patient can tolerate the procedure, a PA and lateral chest x-ray would be of value.   Electronically Signed   By: David  [place]   On: 08/05/2013 12:46    Assessment/Plan  Generalized weakness- in setting of poorly controlled dm, fall and rhabdomylosis. He is working here with therapy team and has made improvement. Continue working with therapy, fall precautions. bp better controlled. Medication compliance at home reinforced. Monitor po intake and encourage hydration  Dm type 2- uncontrolled, recent DKA. Currently cbg under control in the facility between 150-200. Will continue lantus 40 mg daily, glipizide 5 mg daily, metformin 1000 mg twice daily and change humalog to 5 u tid with meals for cbg > 200 only. Continue cozaar for renal protection and baby aspirin. Also continue statin  Hyperlipidemia- continue the fish oil and zocor 40 mg daily  Hypertension- bp well controlled. Continue cozaar 25 mg daily and lopressor 12.5 mg bid  CAD- remains chest pain free. Continue b blocker, ARB, prn NTG, statin and aspirin  Leukocytosis- remains afebrile. Could be stress reaction in hospital in setting of dka. Recheck cbc with diff  Athlete's feet-  continue lotrimin cream to both feet  Family/ staff Communication: reviewed care plan with patient and nursing supervisor  Goals of care: to return home   Labs/tests ordered: cbc, cmp

## 2013-08-16 ENCOUNTER — Non-Acute Institutional Stay (SKILLED_NURSING_FACILITY): Payer: Medicare Other | Admitting: Adult Health

## 2013-08-16 DIAGNOSIS — IMO0001 Reserved for inherently not codable concepts without codable children: Secondary | ICD-10-CM

## 2013-08-16 DIAGNOSIS — I10 Essential (primary) hypertension: Secondary | ICD-10-CM

## 2013-08-16 DIAGNOSIS — E1165 Type 2 diabetes mellitus with hyperglycemia: Secondary | ICD-10-CM

## 2013-08-21 ENCOUNTER — Non-Acute Institutional Stay (SKILLED_NURSING_FACILITY): Payer: Medicare Other | Admitting: Adult Health

## 2013-08-21 ENCOUNTER — Encounter: Payer: Self-pay | Admitting: Adult Health

## 2013-08-21 DIAGNOSIS — R5383 Other fatigue: Secondary | ICD-10-CM

## 2013-08-21 DIAGNOSIS — N183 Chronic kidney disease, stage 3 unspecified: Secondary | ICD-10-CM

## 2013-08-21 DIAGNOSIS — E1165 Type 2 diabetes mellitus with hyperglycemia: Secondary | ICD-10-CM

## 2013-08-21 DIAGNOSIS — I10 Essential (primary) hypertension: Secondary | ICD-10-CM | POA: Insufficient documentation

## 2013-08-21 DIAGNOSIS — IMO0001 Reserved for inherently not codable concepts without codable children: Secondary | ICD-10-CM

## 2013-08-21 DIAGNOSIS — R531 Weakness: Secondary | ICD-10-CM

## 2013-08-21 DIAGNOSIS — R5381 Other malaise: Secondary | ICD-10-CM

## 2013-08-21 MED ORDER — INSULIN LISPRO 100 UNIT/ML ~~LOC~~ SOLN: 5.0000 [IU] | Freq: Three times a day (TID) | SUBCUTANEOUS | Status: DC

## 2013-08-21 NOTE — Progress Notes (Signed)
Patient ID: Ronnie Case, male   DOB: Dec 15, 1940, 73 y.o.   MRN: [id number]     [facility]  Allergies  Allergen Reactions  . Ofloxacin     REACTION: prostate swelling mouth swelling     Chief Complaint  Patient presents with  . Discharge Note    HPI:  He is being discharged to home with home health for pt/ot/nursing. He will need a front wheel walker in order to maintain his current level of independence with his adl's such as bathing or grooming. He will need prescriptions written. He was hospitalized for rhabdomyolysis following a fall at home. He as admitted to this facility for short term rehab and is ready for discharge to home.    Past Medical History  Diagnosis Date  . CAD (coronary artery disease)     s/p MI 1991, stents 1996, 2003, 2005.   . Diabetes mellitus   . Hypothyroidism   . Osteoarthritis   . BPH (benign prostatic hypertrophy)   . MI (myocardial infarction)     Past Surgical History  Procedure Laterality Date  . Thumb amputation      1970 Work accident--left thumb amputated  . Coronary angioplasty with stent placement      Cardiac stents 1996, 2003, 2005  . Elbow surgery      left ( tennis)  . Mastoidectomy      Radical mastoidectomy on right 1982 (infection)  . Repair knee ligament      Left knee repair after fall 2009  . Cataract extraction      B cataract surgery 2010    VITAL SIGNS BP 109/64  Pulse 69  Ht 6' (1.829 m)  Wt 236 lb 9.6 oz (107.321 kg)  BMI 32.08 kg/m2   Patient's Medications  New Prescriptions   No medications on file  Previous Medications   ACCU-CHEK COMPACT TEST DRUM TEST STRIP    TEST BLOOD SUGAR THREE TIMES DAILY   ASPIRIN 81 MG TABLET    Take 81 mg by mouth daily.     FISH OIL-OMEGA-3 FATTY ACIDS 1000 MG CAPSULE    Take 1 g by mouth daily.     GLIPIZIDE (GLUCOTROL) 5 MG TABLET    Take 1 tablet (5 mg total) by mouth daily.   INSULIN GLARGINE (LANTUS) 100 UNIT/ML INJECTION    Inject 0.4 mLs (40 Units total)  into the skin at bedtime.   INSULIN LISPRO (HUMALOG) 100 UNIT/ML INJECTION    Inject 5 Units into the skin 3 (three) times daily before meals. 5 units prior to meals for cbg >=150; will routinely give 5 units prior to lunch and supper.   LOSARTAN (COZAAR) 25 MG TABLET    Take 1 tablet (25 mg total) by mouth daily.   METFORMIN (GLUCOPHAGE) 1000 MG TABLET    1 gram twice daily   METOPROLOL TARTRATE (LOPRESSOR) 25 MG TABLET    Take 0.5 tablets (12.5 mg total) by mouth 2 (two) times daily.   MULTIPLE VITAMINS-MINERALS (ONE-A-DAY 50 PLUS PO)    Take by mouth daily.     NITROGLYCERIN (NITROSTAT) 0.4 MG SL TABLET    Place 1 tablet (0.4 mg total) under the tongue every 5 (five) minutes as needed.   SIMVASTATIN (ZOCOR) 40 MG TABLET    TAKE 1 TABLET BY MOUTH EVERY NIGHT AT BEDTIME  Modified Medications   No medications on file  Discontinued Medications   No medications on file    SIGNIFICANT DIAGNOSTIC EXAMS  LABS REVIEWED: 08-05-13: hgb  a1c 10.1 08-06-13: glucose 329; bun 25; creat 1.24; k+4.1; na++136    Review of Systems  Constitutional: Negative for malaise/fatigue.  Eyes: Negative for blurred vision.  Respiratory: Negative for cough and shortness of breath.   Cardiovascular: Negative for chest pain, palpitations and leg swelling.  Gastrointestinal: Negative for heartburn, abdominal pain and constipation.  Musculoskeletal: Negative for back pain and myalgias.  Skin: Negative.   Neurological: Negative for dizziness, weakness and headaches.  Psychiatric/Behavioral: Negative for depression. The patient is not nervous/anxious.     Physical Exam  Constitutional: He is oriented to person, place, and time. He appears well-developed and well-nourished. No distress.  overweight  Neck: Neck supple. No JVD present.  Cardiovascular: Normal rate, regular rhythm and intact distal pulses.   Respiratory: Effort normal and breath sounds normal. No respiratory distress.  GI: Soft. Bowel sounds are  normal. He exhibits no distension. There is no tenderness.  Musculoskeletal: Normal range of motion. He exhibits no edema.  Neurological: He is alert and oriented to person, place, and time.  Skin: Skin is warm and dry. He is not diaphoretic.  Psychiatric: He has a normal mood and affect.       ASSESSMENT/ PLAN:  Will discharge to home with home health for pt/ot/nursing. Will need a front wheel walker. Prescriptions have been written.   Time spent with patient 40 minutes.

## 2013-08-21 NOTE — Progress Notes (Signed)
Patient ID: Ronnie Case, male   DOB: 1941-06-01, 73 y.o.   MRN: [id number]     [facility]  Allergies  Allergen Reactions  . Ofloxacin     REACTION: prostate swelling mouth swelling     Chief Complaint  Patient presents with  . Acute Visit    diabetes     HPI:  He is being seen for his diabetes. He is concerned that his cbg's have been elevated and he is not receiving any rapid acting insulin until his cbg is >200. I have talked with him regarding the orders that were written for him and he would like to have his medications adjusted. His cbg's are more elevated at lunch and supper.   Past Medical History  Diagnosis Date  . CAD (coronary artery disease)     s/p MI 1991, stents 1996, 2003, 2005.   . Diabetes mellitus   . Hypothyroidism   . Osteoarthritis   . BPH (benign prostatic hypertrophy)   . MI (myocardial infarction)     Past Surgical History  Procedure Laterality Date  . Thumb amputation      1970 Work accident--left thumb amputated  . Coronary angioplasty with stent placement      Cardiac stents 1996, 2003, 2005  . Elbow surgery      left ( tennis)  . Mastoidectomy      Radical mastoidectomy on right 1982 (infection)  . Repair knee ligament      Left knee repair after fall 2009  . Cataract extraction      B cataract surgery 2010    VITAL SIGNS BP 124/78  Pulse 60  Ht 6\' 3"  (1.905 m)  Wt 236 lb 9.6 oz (107.321 kg)  BMI 29.57 kg/m2   Patient's Medications  New Prescriptions   No medications on file  Previous Medications   ACCU-CHEK COMPACT TEST DRUM TEST STRIP    TEST BLOOD SUGAR THREE TIMES DAILY   ASPIRIN 81 MG TABLET    Take 81 mg by mouth daily.     FISH OIL-OMEGA-3 FATTY ACIDS 1000 MG CAPSULE    Take 1 g by mouth daily.     GLIPIZIDE (GLUCOTROL) 5 MG TABLET    Take 1 tablet (5 mg total) by mouth daily.   INSULIN GLARGINE (LANTUS) 100 UNIT/ML INJECTION    Inject 0.4 mLs (40 Units total) into the skin at bedtime.   INSULIN LISPRO (HUMALOG)  100 UNIT/ML INJECTION    Inject 5 Units into the skin 3 (three) times daily before meals. 5 units prior to meals for cbg >200   LOSARTAN (COZAAR) 25 MG TABLET    Take 1 tablet (25 mg total) by mouth daily.   METOPROLOL TARTRATE (LOPRESSOR) 25 MG TABLET    Take 0.5 tablets (12.5 mg total) by mouth 2 (two) times daily.   MULTIPLE VITAMINS-MINERALS (ONE-A-DAY 50 PLUS PO)    Take by mouth daily.     NITROGLYCERIN (NITROSTAT) 0.4 MG SL TABLET    Place 1 tablet (0.4 mg total) under the tongue every 5 (five) minutes as needed.   SIMVASTATIN (ZOCOR) 40 MG TABLET    TAKE 1 TABLET BY MOUTH EVERY NIGHT AT BEDTIME  Modified Medications   Modified Medication Previous Medication   METFORMIN (GLUCOPHAGE) 1000 MG TABLET metFORMIN (GLUCOPHAGE) 1000 MG tablet      1 gram twice daily    TAKE 2 TABLETS BY MOUTH EVERY DAY  Discontinued Medications   INSULIN ASPART (NOVOLOG) 100 UNIT/ML INJECTION  Inject 0-15 Units into the skin 3 (three) times daily with meals.    SIGNIFICANT DIAGNOSTIC EXAMS  LABS REVIEWED: 08-05-13: hgb a1c 10.1 08-06-13: glucose 329; bun 25; creat 1.24; k+4.1; na++136    Review of Systems  Constitutional: Negative for malaise/fatigue.  Eyes: Negative for blurred vision.  Respiratory: Negative for cough and shortness of breath.   Cardiovascular: Negative for chest pain, palpitations and leg swelling.  Gastrointestinal: Negative for heartburn, abdominal pain and constipation.  Musculoskeletal: Negative for back pain and myalgias.  Skin: Negative.   Neurological: Negative for dizziness, weakness and headaches.  Psychiatric/Behavioral: Negative for depression. The patient is not nervous/anxious.     Physical Exam  Constitutional: He is oriented to person, place, and time. He appears well-developed and well-nourished. No distress.  overweight  Neck: Neck supple. No JVD present.  Cardiovascular: Normal rate, regular rhythm and intact distal pulses.   Respiratory: Effort normal and  breath sounds normal. No respiratory distress.  GI: Soft. Bowel sounds are normal. He exhibits no distension. There is no tenderness.  Musculoskeletal: Normal range of motion. He exhibits no edema.  Neurological: He is alert and oriented to person, place, and time.  Skin: Skin is warm and dry. He is not diaphoretic.  Psychiatric: He has a normal mood and affect.      ASSESSMENT/ PLAN:  1. Diabetes: will change the humalog to 5 units prior to all meals for cbg >=150. Will routinely give 5 units humalog prior to lunch and supper. Will monitor his status. Will continue lantus 40 units daily and metformin 1 gm twice daily glipizide 5 mg daily   2. Hypertension: is stable will continue cozaar 25 mg daily and lopressor 12.5 mg twice daily

## 2013-08-23 ENCOUNTER — Encounter: Payer: Self-pay | Admitting: Internal Medicine

## 2013-08-23 ENCOUNTER — Ambulatory Visit (INDEPENDENT_AMBULATORY_CARE_PROVIDER_SITE_OTHER): Payer: Medicare Other | Admitting: Internal Medicine

## 2013-08-23 VITALS — BP 108/68 | HR 73 | Temp 98.1°F | Wt 232.0 lb

## 2013-08-23 DIAGNOSIS — I10 Essential (primary) hypertension: Secondary | ICD-10-CM

## 2013-08-23 DIAGNOSIS — N183 Chronic kidney disease, stage 3 unspecified: Secondary | ICD-10-CM

## 2013-08-23 DIAGNOSIS — IMO0001 Reserved for inherently not codable concepts without codable children: Secondary | ICD-10-CM

## 2013-08-23 DIAGNOSIS — N400 Enlarged prostate without lower urinary tract symptoms: Secondary | ICD-10-CM

## 2013-08-23 DIAGNOSIS — E1165 Type 2 diabetes mellitus with hyperglycemia: Principal | ICD-10-CM

## 2013-08-23 MED ORDER — GLUCOSE BLOOD VI STRP: ORAL_STRIP | Status: AC

## 2013-08-23 NOTE — Patient Instructions (Signed)
Please stay off the diuretic for now Please continue the lantus once a day and the humalog before meals as directed. Please check your sugars before every meal and bring in the numbers at your next visit

## 2013-08-23 NOTE — Assessment & Plan Note (Signed)
flomax in the past but off now Will review how he is doing the next time

## 2013-08-23 NOTE — Assessment & Plan Note (Signed)
BP Readings from Last 3 Encounters:  08/23/13 108/68  08/21/13 109/64  08/16/13 124/78   BP now low Will have him hold off on the diuretic Continue lower doses of losartan and metoprolol

## 2013-08-23 NOTE — Addendum Note (Signed)
Addended by: Sueanne MargaritaSMITH, DESHANNON L on: 08/23/2013 04:41 PM   Modules accepted: Orders

## 2013-08-23 NOTE — Assessment & Plan Note (Signed)
Now on lantus and pre meal humalog Will have him check sugars regularly and discuss it again at follow up soon

## 2013-08-23 NOTE — Assessment & Plan Note (Signed)
Will recheck labs next visit.

## 2013-08-23 NOTE — Progress Notes (Signed)
Subjective:    Patient ID: Ronnie Case, male    DOB: 1941-05-22, 73 y.o.   MRN: [id number]  HPI Here with niece Ronnie Case  Had been having some troubles with dizziness Ronnie Case and was unable to get up Incontinent and vomited---finally crawled to cell phone and called 911. On floor for about 12 hours Admitted with mild DKA and rhabdo Aggressive insulin Rx then to rehab  Now taking humalog 5 units before meals--but he didn't fill the prescription yet Sugar 170 this AM, last night 212 Just got back last night  Still dizzy--goes back 5 years Meclizine no help. Diuretic no help---stopped now  Current Outpatient Prescriptions on File Prior to Visit  Medication Sig Dispense Refill  . ACCU-CHEK COMPACT TEST DRUM test strip TEST BLOOD SUGAR THREE TIMES DAILY  102 each  3  . aspirin 81 MG tablet Take 81 mg by mouth daily.        . fish oil-omega-3 fatty acids 1000 MG capsule Take 1 g by mouth daily.        Marland Kitchen. glipiZIDE (GLUCOTROL) 5 MG tablet Take 1 tablet (5 mg total) by mouth daily.  90 tablet  3  . insulin glargine (LANTUS) 100 UNIT/ML injection Inject 0.4 mLs (40 Units total) into the skin at bedtime.  10 mL  12  . insulin lispro (HUMALOG) 100 UNIT/ML injection Inject 5 Units into the skin 3 (three) times daily before meals. 5 units prior to meals for cbg >=150; will routinely give 5 units prior to lunch and supper.  10 mL  11  . losartan (COZAAR) 25 MG tablet Take 1 tablet (25 mg total) by mouth daily.  30 tablet  2  . metFORMIN (GLUCOPHAGE) 1000 MG tablet 1 gram twice daily      . metoprolol tartrate (LOPRESSOR) 25 MG tablet Take 0.5 tablets (12.5 mg total) by mouth 2 (two) times daily.  180 tablet  3  . Multiple Vitamins-Minerals (ONE-A-DAY 50 PLUS PO) Take by mouth daily.        . nitroGLYCERIN (NITROSTAT) 0.4 MG SL tablet Place 1 tablet (0.4 mg total) under the tongue every 5 (five) minutes as needed.  25 tablet  3  . simvastatin (ZOCOR) 40 MG tablet TAKE 1 TABLET BY MOUTH EVERY NIGHT AT  BEDTIME  90 tablet  1   No current facility-administered medications on file prior to visit.    Allergies  Allergen Reactions  . Ofloxacin     REACTION: prostate swelling mouth swelling    Past Medical History  Diagnosis Date  . CAD (coronary artery disease)     s/p MI 1991, stents 1996, 2003, 2005.   . Diabetes mellitus   . Hypothyroidism   . Osteoarthritis   . BPH (benign prostatic hypertrophy)   . MI (myocardial infarction)     Past Surgical History  Procedure Laterality Date  . Thumb amputation      1970 Work accident--left thumb amputated  . Coronary angioplasty with stent placement      Cardiac stents 1996, 2003, 2005  . Elbow surgery      left ( tennis)  . Mastoidectomy      Radical mastoidectomy on right 1982 (infection)  . Repair knee ligament      Left knee repair after fall 2009  . Cataract extraction      B cataract surgery 2010    Family History  Problem Relation Age of Onset  . Cancer Neg Hx     no prostate or  colon cancer    History   Social History  . Marital Status: Widowed    Spouse Name: N/A    Number of Children: N/A  . Years of Education: N/A   Occupational History  . retired- Passenger transport manager 2003    Social History Main Topics  . Smoking status: Former Games developer  . Smokeless tobacco: Never Used  . Alcohol Use: No  . Drug Use: No  . Sexual Activity: Not on file   Other Topics Concern  . Not on file   Social History Narrative   Ronnie Case's brother.    Has living will.   Niece Ronnie Case has health care POA.   Would accept resuscitation but no prolonged life support.    Not sure about tube feeds            Review of Systems Having urinary incontinence since meds changed--dribbling since off the diuretic Trouble with increased frequency and taking 3 voids to fully empty bladder Not on flomax lately--stopped after put on diuretic since it helped the incontinence     Objective:   Physical Exam  Constitutional:  He appears well-developed and well-nourished. No distress.  Neck: Normal range of motion. Neck supple.  Cardiovascular: Normal rate, regular rhythm and normal heart sounds.  Exam reveals no gallop.   No murmur heard. Pulmonary/Chest: Effort normal and breath sounds normal. No respiratory distress. He has no wheezes. He has no rales.  Musculoskeletal: He exhibits no edema.  Lymphadenopathy:    He has no cervical adenopathy.  Psychiatric: He has a normal mood and affect. His behavior is normal.          Assessment & Plan:

## 2013-08-24 ENCOUNTER — Telehealth: Payer: Self-pay

## 2013-08-24 NOTE — Telephone Encounter (Signed)
Relevant patient education mailed to patient.  

## 2013-08-28 ENCOUNTER — Telehealth: Payer: Self-pay

## 2013-08-28 NOTE — Telephone Encounter (Signed)
Spoke with nurse that verbal orders are ok, per Dr. Alphonsus SiasLetvak

## 2013-08-28 NOTE — Telephone Encounter (Signed)
[facility] home health OT orders for 2 x a week for 3 weeks.Please advise.

## 2013-08-28 NOTE — Telephone Encounter (Signed)
That is fine 

## 2013-09-05 DIAGNOSIS — I129 Hypertensive chronic kidney disease with stage 1 through stage 4 chronic kidney disease, or unspecified chronic kidney disease: Secondary | ICD-10-CM

## 2013-09-05 DIAGNOSIS — M1990 Unspecified osteoarthritis, unspecified site: Secondary | ICD-10-CM

## 2013-09-05 DIAGNOSIS — E119 Type 2 diabetes mellitus without complications: Secondary | ICD-10-CM

## 2013-09-05 DIAGNOSIS — M6282 Rhabdomyolysis: Secondary | ICD-10-CM

## 2013-09-05 DIAGNOSIS — R269 Unspecified abnormalities of gait and mobility: Secondary | ICD-10-CM

## 2013-09-07 ENCOUNTER — Encounter: Payer: Self-pay | Admitting: Internal Medicine

## 2013-09-07 ENCOUNTER — Ambulatory Visit (INDEPENDENT_AMBULATORY_CARE_PROVIDER_SITE_OTHER): Payer: Medicare Other | Admitting: Internal Medicine

## 2013-09-07 VITALS — BP 128/78 | HR 70 | Temp 98.0°F | Wt 230.0 lb

## 2013-09-07 DIAGNOSIS — IMO0001 Reserved for inherently not codable concepts without codable children: Secondary | ICD-10-CM

## 2013-09-07 DIAGNOSIS — N183 Chronic kidney disease, stage 3 unspecified: Secondary | ICD-10-CM

## 2013-09-07 DIAGNOSIS — E1165 Type 2 diabetes mellitus with hyperglycemia: Principal | ICD-10-CM

## 2013-09-07 DIAGNOSIS — N400 Enlarged prostate without lower urinary tract symptoms: Secondary | ICD-10-CM

## 2013-09-07 DIAGNOSIS — I10 Essential (primary) hypertension: Secondary | ICD-10-CM

## 2013-09-07 LAB — BASIC METABOLIC PANEL
BUN: 14 mg/dL (ref 6–23)
CALCIUM: 9.2 mg/dL (ref 8.4–10.5)
CO2: 28 meq/L (ref 19–32)
CREATININE: 1.3 mg/dL (ref 0.4–1.5)
Chloride: 101 mEq/L (ref 96–112)
GFR: 56.22 mL/min — ABNORMAL LOW (ref >60.00)
GLUCOSE: 321 mg/dL — AB (ref 70–99)
Potassium: 4.5 mEq/L (ref 3.5–5.1)
Sodium: 136 mEq/L (ref 135–145)

## 2013-09-07 NOTE — Assessment & Plan Note (Signed)
Still not controlled overnight Will increase the lantus to 50 units and increase by 5 units per week till AM sugars consistently under 150 We can then work on the premeal insulin

## 2013-09-07 NOTE — Progress Notes (Signed)
Subjective:    Patient ID: London Pepper, male    DOB: Nov 15, 1940, 73 y.o.   MRN: [id number]  HPI Here with sister  He feels much better Has 2 therapists and the nurse following up with him Dizziness has improved tremendously Only gets dizzy if he bends down to pick something up  Testing sugars tid Mostly over 200 with all the checks---even in the morning Eating 3 regular meals--- largest is evening meal No snacks lately No hypoglycemic reactions  Voiding okay More hesitancy since off the diuretic Weight is down 2# even off  Current Outpatient Prescriptions on File Prior to Visit  Medication Sig Dispense Refill  . aspirin 81 MG tablet Take 81 mg by mouth daily.        . fish oil-omega-3 fatty acids 1000 MG capsule Take 1 g by mouth daily.        Marland Kitchen glipiZIDE (GLUCOTROL) 5 MG tablet Take 1 tablet (5 mg total) by mouth daily.  90 tablet  3  . glucose blood (BAYER CONTOUR NEXT TEST) test strip Use as instructed to test blood sugar up to 3 times daily dx: 250.02  100 each  11  . insulin glargine (LANTUS) 100 UNIT/ML injection Inject 0.4 mLs (40 Units total) into the skin at bedtime.  10 mL  12  . losartan (COZAAR) 25 MG tablet Take 1 tablet (25 mg total) by mouth daily.  30 tablet  2  . metFORMIN (GLUCOPHAGE) 1000 MG tablet 1 gram twice daily      . metoprolol tartrate (LOPRESSOR) 25 MG tablet Take 0.5 tablets (12.5 mg total) by mouth 2 (two) times daily.  180 tablet  3  . Multiple Vitamins-Minerals (ONE-A-DAY 50 PLUS PO) Take by mouth daily.        . nitroGLYCERIN (NITROSTAT) 0.4 MG SL tablet Place 1 tablet (0.4 mg total) under the tongue every 5 (five) minutes as needed.  25 tablet  3  . simvastatin (ZOCOR) 40 MG tablet TAKE 1 TABLET BY MOUTH EVERY NIGHT AT BEDTIME  90 tablet  1   No current facility-administered medications on file prior to visit.    Allergies  Allergen Reactions  . Ofloxacin     REACTION: prostate swelling mouth swelling    Past Medical History    Diagnosis Date  . CAD (coronary artery disease)     s/p MI 1991, stents 1996, 2003, 2005.   . Diabetes mellitus   . Hypothyroidism   . Osteoarthritis   . BPH (benign prostatic hypertrophy)   . MI (myocardial infarction)     Past Surgical History  Procedure Laterality Date  . Thumb amputation      1970 Work accident--left thumb amputated  . Coronary angioplasty with stent placement      Cardiac stents 1996, 2003, 2005  . Elbow surgery      left ( tennis)  . Mastoidectomy      Radical mastoidectomy on right 1982 (infection)  . Repair knee ligament      Left knee repair after fall 2009  . Cataract extraction      B cataract surgery 2010    Family History  Problem Relation Age of Onset  . Cancer Neg Hx     no prostate or colon cancer    History   Social History  . Marital Status: Widowed    Spouse Name: N/A    Number of Children: N/A  . Years of Education: N/A   Occupational History  . retired- Museum/gallery conservator  assembler 2003    Social History Main Topics  . Smoking status: Former Games developermoker  . Smokeless tobacco: Never Used  . Alcohol Use: No  . Drug Use: No  . Sexual Activity: Not on file   Other Topics Concern  . Not on file   Social History Narrative   Scarlette CalicoFrances Ridge's brother.    Has living will.   Niece Delia ChimesDonna Ingle has health care POA.   Would accept resuscitation but no prolonged life support.    Not sure about tube feeds            Review of Systems Appetite is good Sleep is still not great--occasionally sleeps more in the day    Objective:   Physical Exam        Assessment & Plan:

## 2013-09-07 NOTE — Progress Notes (Signed)
Pre-visit discussion using our clinic review tool. No additional management support is needed unless otherwise documented below in the visit note.  

## 2013-09-07 NOTE — Assessment & Plan Note (Signed)
Will recheck renal function today. 

## 2013-09-07 NOTE — Assessment & Plan Note (Signed)
Mild symptoms Will not add meds

## 2013-09-07 NOTE — Assessment & Plan Note (Signed)
BP Readings from Last 3 Encounters:  09/07/13 128/78  08/23/13 108/68  08/21/13 109/64   Still good control and dizziness is better on less medicine

## 2013-09-07 NOTE — Patient Instructions (Signed)
Please increase the lantus to 50 units daily. In once week, if your sugars are still over 150 in the morning, increase to 55 units. In 2 weeks, if still over 150 in the morning, go up to 60 units. Then in 3 weeks, go up to 65 units if morning sugars are still regularly over 150.

## 2013-09-11 ENCOUNTER — Encounter: Payer: Self-pay | Admitting: *Deleted

## 2013-09-12 ENCOUNTER — Telehealth: Payer: Self-pay | Admitting: Internal Medicine

## 2013-09-12 NOTE — Telephone Encounter (Signed)
Relevant patient education mailed to patient.  

## 2013-09-19 ENCOUNTER — Other Ambulatory Visit: Payer: Self-pay | Admitting: Adult Health

## 2013-10-08 ENCOUNTER — Ambulatory Visit (INDEPENDENT_AMBULATORY_CARE_PROVIDER_SITE_OTHER): Payer: Medicare Other | Admitting: Internal Medicine

## 2013-10-08 ENCOUNTER — Encounter: Payer: Self-pay | Admitting: Internal Medicine

## 2013-10-08 VITALS — BP 148/70 | HR 90 | Temp 98.3°F | Wt 232.0 lb

## 2013-10-08 DIAGNOSIS — L03119 Cellulitis of unspecified part of limb: Secondary | ICD-10-CM

## 2013-10-08 DIAGNOSIS — I2510 Atherosclerotic heart disease of native coronary artery without angina pectoris: Secondary | ICD-10-CM

## 2013-10-08 DIAGNOSIS — L03115 Cellulitis of right lower limb: Secondary | ICD-10-CM

## 2013-10-08 DIAGNOSIS — R42 Dizziness and giddiness: Secondary | ICD-10-CM | POA: Insufficient documentation

## 2013-10-08 DIAGNOSIS — E1129 Type 2 diabetes mellitus with other diabetic kidney complication: Secondary | ICD-10-CM

## 2013-10-08 DIAGNOSIS — E1165 Type 2 diabetes mellitus with hyperglycemia: Secondary | ICD-10-CM

## 2013-10-08 DIAGNOSIS — L02419 Cutaneous abscess of limb, unspecified: Secondary | ICD-10-CM

## 2013-10-08 MED ORDER — CEPHALEXIN 500 MG PO CAPS: 500.0000 mg | ORAL_CAPSULE | Freq: Three times a day (TID) | ORAL | Status: DC

## 2013-10-08 NOTE — Assessment & Plan Note (Signed)
Will treat with cephalexin and change if not responding

## 2013-10-08 NOTE — Progress Notes (Signed)
Subjective:    Patient ID: Ronnie Case, male    DOB: 1940/12/01, 73 y.o.   MRN: [id number]  HPI Doing okay Has increased the lantus---taking 50 or 55 daily Variable AM sugars --- as low as 108 but often over 200 still Got some hypoglycemia when he tried 60  humalog 12-15 units tid Has had some low sugars 50,66, 57 before lunch or supper--hasn't had symptoms with these though Did have 44 before dinner once--- did feel this a little but just able to eat Before supper sugars are better--- rarely over 200  Has forgotten the lantus some nights---will fall asleep and then forget it Discussed changing this to AM  Current Outpatient Prescriptions on File Prior to Visit  Medication Sig Dispense Refill  . aspirin 81 MG tablet Take 81 mg by mouth daily.        . fish oil-omega-3 fatty acids 1000 MG capsule Take 1 g by mouth daily.        Marland Kitchen. glipiZIDE (GLUCOTROL) 5 MG tablet Take 1 tablet (5 mg total) by mouth daily.  90 tablet  3  . glucose blood (BAYER CONTOUR NEXT TEST) test strip Use as instructed to test blood sugar up to 3 times daily dx: 250.02  100 each  11  . insulin glargine (LANTUS) 100 UNIT/ML injection Inject 50 Units into the skin at bedtime.      . insulin lispro (HUMALOG) 100 UNIT/ML injection Inject 10 Units into the skin 3 (three) times daily before meals.       Marland Kitchen. losartan (COZAAR) 25 MG tablet Take 1 tablet (25 mg total) by mouth daily.  30 tablet  2  . metFORMIN (GLUCOPHAGE) 1000 MG tablet 1 gram twice daily      . metoprolol tartrate (LOPRESSOR) 25 MG tablet Take 0.5 tablets (12.5 mg total) by mouth 2 (two) times daily.  180 tablet  3  . Multiple Vitamins-Minerals (ONE-A-DAY 50 PLUS PO) Take by mouth daily.        . nitroGLYCERIN (NITROSTAT) 0.4 MG SL tablet Place 1 tablet (0.4 mg total) under the tongue every 5 (five) minutes as needed.  25 tablet  3  . simvastatin (ZOCOR) 40 MG tablet TAKE 1 TABLET BY MOUTH EVERY NIGHT AT BEDTIME  90 tablet  1   No current  facility-administered medications on file prior to visit.    Allergies  Allergen Reactions  . Ofloxacin     REACTION: prostate swelling mouth swelling    Past Medical History  Diagnosis Date  . CAD (coronary artery disease)     s/p MI 1991, stents 1996, 2003, 2005.   . Diabetes mellitus   . Hypothyroidism   . Osteoarthritis   . BPH (benign prostatic hypertrophy)   . MI (myocardial infarction)     Past Surgical History  Procedure Laterality Date  . Thumb amputation      1970 Work accident--left thumb amputated  . Coronary angioplasty with stent placement      Cardiac stents 1996, 2003, 2005  . Elbow surgery      left ( tennis)  . Mastoidectomy      Radical mastoidectomy on right 1982 (infection)  . Repair knee ligament      Left knee repair after fall 2009  . Cataract extraction      B cataract surgery 2010    Family History  Problem Relation Age of Onset  . Cancer Neg Hx     no prostate or colon cancer  History   Social History  . Marital Status: Widowed    Spouse Name: N/A    Number of Children: N/A  . Years of Education: N/A   Occupational History  . retired- Passenger transport manager 2003    Social History Main Topics  . Smoking status: Former Games developer  . Smokeless tobacco: Never Used  . Alcohol Use: No  . Drug Use: No  . Sexual Activity: Not on file   Other Topics Concern  . Not on file   Social History Narrative   [name] Ridge's brother.    Has living will.   Niece Delia Chimes has health care POA.   Would accept resuscitation but no prolonged life support.    Not sure about tube feeds            Review of Systems Fell outside [place] about 8-9 days ago--opened up area on right knee Still dizzy---tried to reach down to pick something up and lost balance Only has cane---but needs walker since can't keep balance with the cane Sleeping fine-in chair     Objective:   Physical Exam  Constitutional: He appears well-developed and  well-nourished. No distress.  Skin:  Granulated ulcer over right patella Mild redness, warmth and tenderness inferior to this          Assessment & Plan:

## 2013-10-08 NOTE — Patient Instructions (Signed)
Please start the antibiotic for your knee. If the redness isn't better in 2 days, let me know. You can take the lantus in the morning.

## 2013-10-08 NOTE — Assessment & Plan Note (Signed)
Seems to be a balance thing Will give Rx for 2 wheeled walker

## 2013-10-08 NOTE — Progress Notes (Signed)
Pre visit review using our clinic review tool, if applicable. No additional management support is needed unless otherwise documented below in the visit note. 

## 2013-10-08 NOTE — Assessment & Plan Note (Signed)
Seems to be somewhat better Will recheck in 2 months or so

## 2013-11-05 ENCOUNTER — Ambulatory Visit (INDEPENDENT_AMBULATORY_CARE_PROVIDER_SITE_OTHER): Payer: Medicare Other | Admitting: Internal Medicine

## 2013-11-05 ENCOUNTER — Encounter: Payer: Self-pay | Admitting: Internal Medicine

## 2013-11-05 VITALS — BP 148/80 | HR 72 | Temp 98.0°F | Ht 72.0 in | Wt 236.0 lb

## 2013-11-05 DIAGNOSIS — I2510 Atherosclerotic heart disease of native coronary artery without angina pectoris: Secondary | ICD-10-CM

## 2013-11-05 DIAGNOSIS — Z0000 Encounter for general adult medical examination without abnormal findings: Secondary | ICD-10-CM

## 2013-11-05 DIAGNOSIS — N183 Chronic kidney disease, stage 3 unspecified: Secondary | ICD-10-CM

## 2013-11-05 DIAGNOSIS — E1129 Type 2 diabetes mellitus with other diabetic kidney complication: Secondary | ICD-10-CM

## 2013-11-05 DIAGNOSIS — G3184 Mild cognitive impairment, so stated: Secondary | ICD-10-CM

## 2013-11-05 DIAGNOSIS — R413 Other amnesia: Secondary | ICD-10-CM

## 2013-11-05 DIAGNOSIS — E1165 Type 2 diabetes mellitus with hyperglycemia: Secondary | ICD-10-CM

## 2013-11-05 DIAGNOSIS — I10 Essential (primary) hypertension: Secondary | ICD-10-CM

## 2013-11-05 DIAGNOSIS — Z23 Encounter for immunization: Secondary | ICD-10-CM

## 2013-11-05 LAB — VITAMIN B12: Vitamin B-12: 304 pg/mL (ref 211–911)

## 2013-11-05 LAB — COMPREHENSIVE METABOLIC PANEL
ALT: 19 U/L (ref 0–53)
AST: 16 U/L (ref 0–37)
Albumin: 4.2 g/dL (ref 3.5–5.2)
Alkaline Phosphatase: 62 U/L (ref 39–117)
BUN: 18 mg/dL (ref 6–23)
CO2: 28 meq/L (ref 19–32)
Calcium: 9.6 mg/dL (ref 8.4–10.5)
Chloride: 103 mEq/L (ref 96–112)
Creatinine, Ser: 1.2 mg/dL (ref 0.4–1.5)
GFR: 63.34 mL/min (ref >60.00)
GLUCOSE: 207 mg/dL — AB (ref 70–99)
Potassium: 4.1 mEq/L (ref 3.5–5.1)
SODIUM: 140 meq/L (ref 135–145)
TOTAL PROTEIN: 7.3 g/dL (ref 6.0–8.3)
Total Bilirubin: 0.7 mg/dL (ref 0.3–1.2)

## 2013-11-05 LAB — HEMOGLOBIN A1C: Hgb A1c MFr Bld: 8.6 % — ABNORMAL HIGH (ref 4.6–6.5)

## 2013-11-05 LAB — TSH: TSH: 1.11 u[IU]/mL (ref 0.35–5.50)

## 2013-11-05 LAB — T4, FREE: Free T4: 0.74 ng/dL (ref 0.60–1.60)

## 2013-11-05 LAB — HM DIABETES FOOT EXAM

## 2013-11-05 MED ORDER — INSULIN LISPRO 100 UNIT/ML ~~LOC~~ SOLN: 20.0000 [IU] | Freq: Three times a day (TID) | SUBCUTANEOUS | Status: DC

## 2013-11-05 MED ORDER — INSULIN GLARGINE 100 UNIT/ML ~~LOC~~ SOLN: 50.0000 [IU] | Freq: Every day | SUBCUTANEOUS | Status: DC

## 2013-11-05 NOTE — Assessment & Plan Note (Signed)
Very brittle--up and down Hopefully under 9% but wouldn't add more meds due to hypoglycemia risk

## 2013-11-05 NOTE — Progress Notes (Signed)
Subjective:    Patient ID: Ronnie Case, male    DOB: 01/08/1941, 73 y.o.   MRN: [id number]  HPI Here for Medicare wellness visit and follow up Reviewed form Did have fall with injury Reviewed advanced directives No tobacco or alcohol Only other doctor is for eyes and ears Hearing is poor. Vision is fair Tries to ride an exercise bike No significant memory problems  Knee did heal up Gets some pain with some movements Not walking much but does ride a bike (has one in home)  Dizziness persists Things can be "a little blurry to me" Feels okay in his recliner--feels it after standing up (or just sitting down) No problems turning in bed Using cane in house and occasionally walker  Checking sugars 2-3 times per day Average is about 190 Very labile with highs and lows humalog 20 units tid and lantus 50 daily Not feeling low physically ---- 65-70 is lowest (and he doesn't have symptoms) Marginally compliant with diet (hard to tell from what he says)  No chest pain No palpitations but heart will go a bit aflutter for a woman neighbor (no relationship though) No SOB No foot swelling  Current Outpatient Prescriptions on File Prior to Visit  Medication Sig Dispense Refill  . aspirin 81 MG tablet Take 81 mg by mouth daily.        . fish oil-omega-3 fatty acids 1000 MG capsule Take 1 g by mouth daily.        Marland Kitchen glipiZIDE (GLUCOTROL) 5 MG tablet Take 1 tablet (5 mg total) by mouth daily.  90 tablet  3  . glucose blood (BAYER CONTOUR NEXT TEST) test strip Use as instructed to test blood sugar up to 3 times daily dx: 250.02  100 each  11  . insulin glargine (LANTUS) 100 UNIT/ML injection Inject 50 Units into the skin daily. In the morning      . insulin lispro (HUMALOG) 100 UNIT/ML injection Inject 20-30 Units into the skin 3 (three) times daily before meals.       Marland Kitchen losartan (COZAAR) 25 MG tablet Take 1 tablet (25 mg total) by mouth daily.  30 tablet  2  . metFORMIN (GLUCOPHAGE) 1000  MG tablet 1 gram twice daily      . metoprolol tartrate (LOPRESSOR) 25 MG tablet Take 0.5 tablets (12.5 mg total) by mouth 2 (two) times daily.  180 tablet  3  . Multiple Vitamins-Minerals (ONE-A-DAY 50 PLUS PO) Take by mouth daily.        . nitroGLYCERIN (NITROSTAT) 0.4 MG SL tablet Place 1 tablet (0.4 mg total) under the tongue every 5 (five) minutes as needed.  25 tablet  3  . simvastatin (ZOCOR) 40 MG tablet TAKE 1 TABLET BY MOUTH EVERY NIGHT AT BEDTIME  90 tablet  1   No current facility-administered medications on file prior to visit.    Allergies  Allergen Reactions  . Ofloxacin     REACTION: prostate swelling mouth swelling    Past Medical History  Diagnosis Date  . CAD (coronary artery disease)     s/p MI 1991, stents 1996, 2003, 2005.   . Diabetes mellitus   . Hypothyroidism   . Osteoarthritis   . BPH (benign prostatic hypertrophy)   . MI (myocardial infarction)     Past Surgical History  Procedure Laterality Date  . Thumb amputation      1970 Work accident--left thumb amputated  . Coronary angioplasty with stent placement  Cardiac stents 1996, 2003, 2005  . Elbow surgery      left ( tennis)  . Mastoidectomy      Radical mastoidectomy on right 1982 (infection)  . Repair knee ligament      Left knee repair after fall 2009  . Cataract extraction      B cataract surgery 2010    Family History  Problem Relation Age of Onset  . Cancer Neg Hx     no prostate or colon cancer    History   Social History  . Marital Status: Widowed    Spouse Name: N/A    Number of Children: N/A  . Years of Education: N/A   Occupational History  . retired- Passenger transport managerindustrial assembler 2003    Social History Main Topics  . Smoking status: Former Games developermoker  . Smokeless tobacco: Never Used  . Alcohol Use: No  . Drug Use: No  . Sexual Activity: Not on file   Other Topics Concern  . Not on file   Social History Narrative   Scarlette CalicoFrances Ridge's brother.    Has living will.    Niece Delia ChimesDonna Ingle has health care POA.   Would accept resuscitation but no prolonged life support.    Wouldn't want tube feeds if cognitively unaware               Review of Systems Weight is up 4# Sleeping better in day than at night. Long nap and then hard time initiating at night Voids okay--has had some leaking (incontinence occasionally)--has pads but doesn't use them Bowels are okay    Objective:   Physical Exam  Constitutional: He is oriented to person, place, and time. He appears well-developed and well-nourished. No distress.  Neck: Normal range of motion. Neck supple. No thyromegaly present.  Cardiovascular: Normal rate, regular rhythm, normal heart sounds and intact distal pulses.  Exam reveals no gallop.   No murmur heard. Pulmonary/Chest: Effort normal and breath sounds normal. No respiratory distress. He has no wheezes. He has no rales.  Abdominal: Soft. There is no tenderness.  Musculoskeletal: He exhibits no edema and no tenderness.  Lymphadenopathy:    He has no cervical adenopathy.  Neurological: He is alert and oriented to person, place, and time.  President-- "[place], [place], [place]" [phone] D-o-l-w Recall 0/3  Skin:  Thick long mycotic toenails Slight callous under right great toe Flaking but no ulcers  Psychiatric: He has a normal mood and affect. His behavior is normal.          Assessment & Plan:

## 2013-11-05 NOTE — Patient Instructions (Signed)
Please set up your diabetic eye exam.  Diabetes Meal Planning Guide The diabetes meal planning guide is a tool to help you plan your meals and snacks. It is important for people with diabetes to manage their blood glucose (sugar) levels. Choosing the right foods and the right amounts throughout your day will help control your blood glucose. Eating right can even help you improve your blood pressure and reach or maintain a healthy weight. CARBOHYDRATE COUNTING MADE EASY When you eat carbohydrates, they turn to sugar. This raises your blood glucose level. Counting carbohydrates can help you control this level so you feel better. When you plan your meals by counting carbohydrates, you can have more flexibility in what you eat and balance your medicine with your food intake. Carbohydrate counting simply means adding up the total amount of carbohydrate grams in your meals and snacks. Try to eat about the same amount at each meal. Foods with carbohydrates are listed below. Each portion below is 1 carbohydrate serving or 15 grams of carbohydrates. Ask your dietician how many grams of carbohydrates you should eat at each meal or snack. Grains and Starches  1 slice bread.   English muffin or hotdog/hamburger bun.   cup cold cereal (unsweetened).   cup cooked pasta or rice.   cup starchy vegetables (corn, potatoes, peas, beans, winter squash).  1 tortilla (6 inches).   bagel.  1 waffle or pancake (size of a CD).   cup cooked cereal.  4 to 6 small crackers. *Whole grain is recommended. Fruit  1 cup fresh unsweetened berries, melon, papaya, pineapple.  1 small fresh fruit.   banana or mango.   cup fruit juice (4 oz unsweetened).   cup canned fruit in natural juice or water.  2 tbs dried fruit.  12 to 15 grapes or cherries. Milk and Yogurt  1 cup fat-free or 1% milk.  1 cup soy milk.  6 oz light yogurt with sugar-free sweetener.  6 oz low-fat soy yogurt.  6 oz plain  yogurt. Vegetables  1 cup raw or  cup cooked is counted as 0 carbohydrates or a "free" food.  If you eat 3 or more servings at 1 meal, count them as 1 carbohydrate serving. Other Carbohydrates   oz chips or pretzels.   cup ice cream or frozen yogurt.   cup sherbet or sorbet.  2 inch square cake, no frosting.  1 tbs honey, sugar, jam, jelly, or syrup.  2 small cookies.  3 squares of graham crackers.  3 cups popcorn.  6 crackers.  1 cup broth-based soup.  Count 1 cup casserole or other mixed foods as 2 carbohydrate servings.  Foods with less than 20 calories in a serving may be counted as 0 carbohydrates or a "free" food. You may want to purchase a book or computer software that lists the carbohydrate gram counts of different foods. In addition, the nutrition facts panel on the labels of the foods you eat are a good source of this information. The label will tell you how big the serving size is and the total number of carbohydrate grams you will be eating per serving. Divide this number by 15 to obtain the number of carbohydrate servings in a portion. Remember, 1 carbohydrate serving equals 15 grams of carbohydrate. SERVING SIZES Measuring foods and serving sizes helps you make sure you are getting the right amount of food. The list below tells how big or small some common serving sizes are.  1 oz.........4 stacked dice.    3 oz.........Deck of cards.  1 tsp........Tip of little finger.  1 tbs........Thumb.  2 tbs........Golf ball.   cup.......Half of a fist.  1 cup........A fist. SAMPLE DIABETES MEAL PLAN Below is a sample meal plan that includes foods from the grain and starches, dairy, vegetable, fruit, and meat groups. A dietician can individualize a meal plan to fit your calorie needs and tell you the number of servings needed from each food group. However, controlling the total amount of carbohydrates in your meal or snack is more important than making sure you  include all of the food groups at every meal. You may interchange carbohydrate containing foods (dairy, starches, and fruits). The meal plan below is an example of a 2000 calorie diet using carbohydrate counting. This meal plan has 17 carbohydrate servings. Breakfast  1 cup oatmeal (2 carb servings).   cup light yogurt (1 carb serving).  1 cup blueberries (1 carb serving).   cup almonds. Snack  1 large apple (2 carb servings).  1 low-fat string cheese stick. Lunch  Chicken breast salad.  1 cup spinach.   cup chopped tomatoes.  2 oz chicken breast, sliced.  2 tbs low-fat Italian dressing.  12 whole-wheat crackers (2 carb servings).  12 to 15 grapes (1 carb serving).  1 cup low-fat milk (1 carb serving). Snack  1 cup carrots.   cup hummus (1 carb serving). Dinner  3 oz broiled salmon.  1 cup brown rice (3 carb servings). Snack  1  cups steamed broccoli (1 carb serving) drizzled with 1 tsp olive oil and lemon juice.  1 cup light pudding (2 carb servings). DIABETES MEAL PLANNING WORKSHEET Your dietician can use this worksheet to help you decide how many servings of foods and what types of foods are right for you.  BREAKFAST Food Group and Servings / Carb Servings Grain/Starches __________________________________ Dairy __________________________________________ Vegetable ______________________________________ Fruit ___________________________________________ Meat __________________________________________ Fat ____________________________________________ LUNCH Food Group and Servings / Carb Servings Grain/Starches ___________________________________ Dairy ___________________________________________ Fruit ____________________________________________ Meat ___________________________________________ Fat _____________________________________________ DINNER Food Group and Servings / Carb Servings Grain/Starches ___________________________________ Dairy  ___________________________________________ Fruit ____________________________________________ Meat ___________________________________________ Fat _____________________________________________ SNACKS Food Group and Servings / Carb Servings Grain/Starches ___________________________________ Dairy ___________________________________________ Vegetable _______________________________________ Fruit ____________________________________________ Meat ___________________________________________ Fat _____________________________________________ DAILY TOTALS Starches _________________________ Vegetable ________________________ Fruit ____________________________ Dairy ____________________________ Meat ____________________________ Fat ______________________________ Document Released: 04/22/2005 Document Revised: 10/18/2011 Document Reviewed: 03/03/2009 [employer] Patient Information 2014 [employer].  

## 2013-11-05 NOTE — Assessment & Plan Note (Signed)
Will recheck labs 

## 2013-11-05 NOTE — Assessment & Plan Note (Signed)
Does okay functionally Niece checks on him regularly Will check B12

## 2013-11-05 NOTE — Progress Notes (Signed)
Pre visit review using our clinic review tool, if applicable. No additional management support is needed unless otherwise documented below in the visit note. 

## 2013-11-05 NOTE — Assessment & Plan Note (Signed)
Has been quiet On appropriate regimen 

## 2013-11-05 NOTE — Addendum Note (Signed)
Addended by: Sueanne MargaritaSMITH, DESHANNON L on: 11/05/2013 12:52 PM   Modules accepted: Orders

## 2013-11-05 NOTE — Assessment & Plan Note (Signed)
I have personally reviewed the Medicare Annual Wellness questionnaire and have noted 1. The patient's medical and social history 2. Their use of alcohol, tobacco or illicit drugs 3. Their current medications and supplements 4. The patient's functional ability including ADL's, fall risks, home safety risks and hearing or visual             impairment. 5. Diet and physical activities 6. Evidence for depression or mood disorders  The patients weight, height, BMI and visual acuity have been recorded in the chart I have made referrals, counseling and provided education to the patient based review of the above and I have provided the pt with a written personalized care plan for preventive services.  I have provided you with a copy of your personalized plan for preventive services. Please take the time to review along with your updated medication list.  Given other conditions, will not repeat colonoscopy unless symptoms Now with memory problems--niece helps some

## 2013-11-05 NOTE — Assessment & Plan Note (Signed)
BP Readings from Last 3 Encounters:  11/05/13 148/80  10/08/13 148/70  09/07/13 128/78   Given ongoing dizziness, will hold off on increasing meds

## 2013-11-06 ENCOUNTER — Encounter: Payer: Self-pay | Admitting: *Deleted

## 2013-11-06 LAB — CBC WITH DIFFERENTIAL/PLATELET
Basophils Absolute: 0 10*3/uL (ref 0.0–0.1)
Basophils Relative: 0.1 % (ref 0.0–3.0)
EOS ABS: 0.7 10*3/uL (ref 0.0–0.7)
Eosinophils Relative: 9 % — ABNORMAL HIGH (ref 0.0–5.0)
HEMATOCRIT: 46.2 % (ref 39.0–52.0)
Hemoglobin: 15.4 g/dL (ref 13.0–17.0)
LYMPHS ABS: 1.1 10*3/uL (ref 0.7–4.0)
Lymphocytes Relative: 15.9 % (ref 12.0–46.0)
MCHC: 33.4 g/dL (ref 30.0–36.0)
MCV: 92.1 fl (ref 78.0–100.0)
Monocytes Absolute: 0.7 10*3/uL (ref 0.1–1.0)
Monocytes Relative: 9 % (ref 3.0–12.0)
NEUTROS ABS: 4.8 10*3/uL (ref 1.4–7.7)
Neutrophils Relative %: 66 % (ref 43.0–77.0)
Platelets: 186 10*3/uL (ref 150.0–400.0)
RBC: 5.01 Mil/uL (ref 4.22–5.81)
RDW: 13.7 % (ref 11.5–14.6)
WBC: 7.2 10*3/uL (ref 4.5–10.5)

## 2013-11-15 ENCOUNTER — Other Ambulatory Visit: Payer: Self-pay | Admitting: Adult Health

## 2013-11-16 ENCOUNTER — Other Ambulatory Visit: Payer: Self-pay | Admitting: Internal Medicine

## 2014-03-04 ENCOUNTER — Other Ambulatory Visit: Payer: Self-pay | Admitting: Internal Medicine

## 2014-03-04 ENCOUNTER — Other Ambulatory Visit: Payer: Self-pay | Admitting: Adult Health

## 2014-03-08 ENCOUNTER — Ambulatory Visit: Payer: Medicare Other | Admitting: Internal Medicine

## 2014-03-08 DIAGNOSIS — Z0289 Encounter for other administrative examinations: Secondary | ICD-10-CM

## 2014-04-04 ENCOUNTER — Other Ambulatory Visit: Payer: Self-pay | Admitting: Internal Medicine

## 2014-05-06 ENCOUNTER — Other Ambulatory Visit: Payer: Self-pay | Admitting: Internal Medicine

## 2014-05-06 NOTE — Telephone Encounter (Signed)
[facility] [address] elelctonically request refill humalog,lantus and simvastatin; done.

## 2014-06-05 ENCOUNTER — Other Ambulatory Visit: Payer: Self-pay | Admitting: Internal Medicine

## 2014-06-11 ENCOUNTER — Other Ambulatory Visit: Payer: Self-pay | Admitting: Internal Medicine

## 2014-07-10 ENCOUNTER — Other Ambulatory Visit: Payer: Self-pay | Admitting: Internal Medicine

## 2014-08-19 ENCOUNTER — Other Ambulatory Visit: Payer: Self-pay | Admitting: Internal Medicine

## 2014-09-30 ENCOUNTER — Telehealth: Payer: Self-pay | Admitting: Internal Medicine

## 2014-09-30 NOTE — Telephone Encounter (Signed)
Patient's sister,Frances,called to let you know patient passed away.  His niece found him dead in his home.  The coroner said it was of natural causes.

## 2014-09-30 NOTE — Telephone Encounter (Signed)
PLEASE NOTE: All timestamps contained within this report are represented as [place]. CONFIDENTIALTY NOTICE: This fax transmission is intended only for the addressee. It contains information that is legally privileged, confidential or otherwise protected from use or disclosure. If you are not the intended recipient, you are strictly prohibited from reviewing, disclosing, copying using or disseminating any of this information or taking any action in reliance on or regarding this information. If you have received this fax in error, please notify us immediately by telephone so that we can arrange for its return to us. Phone: [phone], Toll-Free: [phone], Fax: [phone] Page: 1 of 2 Call Id: [id number] [facility] Client TELEPHONE ADVICE RECORD [facility] Patient Name: Ronnie Case Gender: Male DOB: 1941-02-03 Age: [age] Y 10 M 24 D Return Phone Number: Address: City/State/Zip: [place] Client [facility] Night Physician Tillman AbideLetvak, Richard Contact Type Call Call Type Triage / Clinical Caller Name Geraldine SolarHeather Williamson Relationship To Patient Provider Return Phone Number Unavailable Chief Complaint DEATH - has occurred or is imminent or pending Initial Comment Caller iHeather Clinton SawyerWilliamson - EMS [phone] and a death has occurred and caller needs to speak to on call about signing death certificate. PreDisposition Call Doctor Nurse Assessment Guidelines Guideline Title Affirmed Question Affirmed Notes Nurse Date/Time Lamount Cohen(Eastern Time) PCP Call - No Triage Notification of death Quentin CornwallClaiborne, RN, Janet 2014-09-11 7:30:20 PM Disp. Time Lamount Cohen(Eastern Time) Disposition Final User 2014-09-11 7:00:57 PM [facility] to [facility] Paging Queue Vivianne MasterKing-Hussey, Berdi 2014-09-11 7:10:28 PM Send to Urgent Sarita HaverQueue Wisdom, Zachary 2014-09-11 7:14:39 PM Send To RN Personal Gasper Sellslaiborne, RN,  Marylu LundJanet 2014-09-11 7:21:04 PM Send To RN Personal Moshe CiproBritt, RN, Byrd HesselbachMaria 2014-09-11 7:24:38 PM Called On-Call Provider Gasper Sellslaiborne, RN, Marylu LundJanet 2014-09-11 7:32:39 PM Call Completed Gasper Sellslaiborne, RN, Marylu LundJanet 2014-09-11 7:31:25 PM Call PCP Now Yes Gasper Sellslaiborne, RN, Herbert PunJanet Caller Understands: Yes Disagree/Comply: Comply Care Advice Given Per Guideline CALL PCP NOW: You need to discuss this with your doctor. I'll page him now. If you haven't heard from the on-call doctor within 30 minutes, call again. CARE ADVICE given per PCP Call - No Triage (Adult) guideline. After Care Instructions Given Call Event Type User Date / Time Description PLEASE NOTE: All timestamps contained within this report are represented as [place]. CONFIDENTIALTY NOTICE: This fax transmission is intended only for the addressee. It contains information that is legally privileged, confidential or otherwise protected from use or disclosure. If you are not the intended recipient, you are strictly prohibited from reviewing, disclosing, copying using or disseminating any of this information or taking any action in reliance on or regarding this information. If you have received this fax in error, please notify us immediately by telephone so that we can arrange for its return to us. Phone: [phone], Toll-Free: [phone], Fax: [phone] Page: 2 of 2 Call Id: [id number] Comments User: Jason CoopJanet, Claiborne, RN Date/Time Lamount Cohen(Eastern Time): 2014-09-11 7:32:26 PM EMS reports to Dr that pt had long standing health issues. NO one had seen for 2 weeks. Found in supine position, mottled and rigored. User: Jason CoopJanet, Claiborne, RN Date/Time Lamount Cohen(Eastern Time): 2014-09-11 7:37:31 PM Dr Madelon Lipspanosh okay'd the EMS to sign Dr Karle StarchLetvak's name to the death certificate so he could be taken by funeral home. Paging DoctorName DoctorPhone DateTime Result/Outcome Notes Berniece Andreasanosh, Wanda [phone] 2014-09-11 7:24:38 PM Called On Call Provider - Reached Berniece Andreasanosh, Wanda 2014-09-11  7:26:12 PM Spoke with On Call - Outcome Notification notified of  death, warm trsfrd to EMS

## 2014-09-30 NOTE — Telephone Encounter (Signed)
Called niece and passed on my condolences

## 2014-10-01 IMAGING — CR DG CHEST 1V PORT
1 series · 1 of 1 positions shown · non-contrast
Comparison: none

CLINICAL DATA: Weakness, history MI and placement of coronary
artery DIS dense

EXAM:
PORTABLE CHEST - 1 VIEW

[AP]
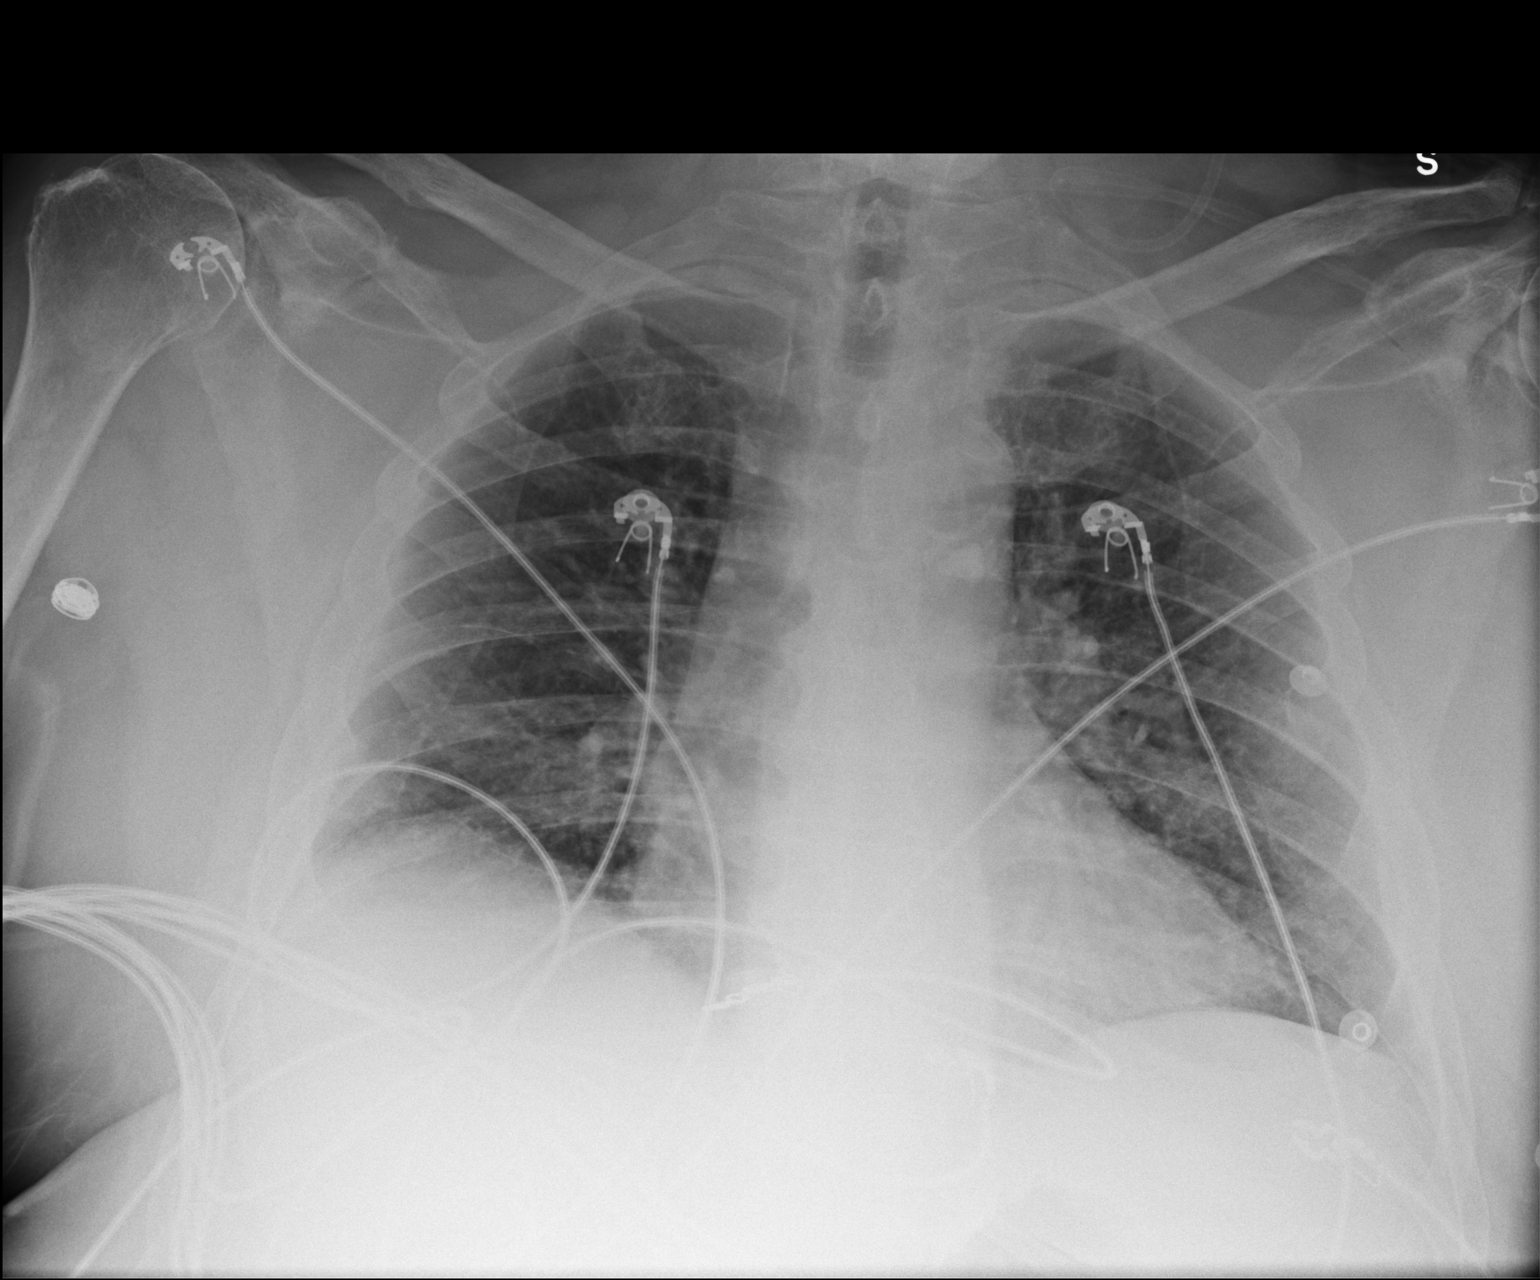

[1 of 1 positions shown; findings below may reference images not displayed]

FINDINGS: The lungs are mildly hypoinflated. The interstitial markings are
prominent bilaterally. There is no pleural effusion or pneumothorax.
The cardiopericardial silhouette is mildly enlarged. The central
pulmonary vascularity is prominent. The trachea is midline.
Calcified lymph nodes are present in the mediastinum. The observed
portions of the bony thorax appear normal.
IMPRESSION: The findings suggest low-grade pulmonary interstitial edema. There
is no focal pneumonia nor significant pleural effusion. The findings
are accentuated by mild bilateral pulmonary hypo inflation. When the
patient can tolerate the procedure, a PA and lateral chest x-ray
would be of value.

## 2014-10-08 DEATH — deceased
